# Patient Record
Sex: Female | Born: 1960 | Race: Black or African American | Hispanic: No | Smoking: Never smoker
Health system: Southern US, Community
[De-identification: ages and names within clinical notes are randomized; demographics above are authoritative.]

## PROBLEM LIST (undated history)

## (undated) ENCOUNTER — Emergency Department: Discharge status: Absent without leave | Payer: Self-pay

## (undated) DIAGNOSIS — IMO0001 Reserved for inherently not codable concepts without codable children: Secondary | ICD-10-CM

## (undated) DIAGNOSIS — J45909 Unspecified asthma, uncomplicated: Secondary | ICD-10-CM

## (undated) DIAGNOSIS — R030 Elevated blood-pressure reading, without diagnosis of hypertension: Secondary | ICD-10-CM

## (undated) DIAGNOSIS — M1990 Unspecified osteoarthritis, unspecified site: Secondary | ICD-10-CM

## (undated) HISTORY — DX: Unspecified osteoarthritis, unspecified site: M19.90

## (undated) HISTORY — DX: Elevated blood-pressure reading, without diagnosis of hypertension: R03.0

## (undated) HISTORY — DX: Reserved for inherently not codable concepts without codable children: IMO0001

## (undated) HISTORY — DX: Unspecified asthma, uncomplicated: J45.909

---

## 2006-12-11 ENCOUNTER — Emergency Department: Payer: Self-pay | Admitting: Internal Medicine

## 2006-12-24 ENCOUNTER — Encounter: Payer: Self-pay | Admitting: Obstetrics & Gynecology

## 2006-12-24 ENCOUNTER — Ambulatory Visit: Payer: Self-pay | Admitting: Obstetrics & Gynecology

## 2007-09-28 ENCOUNTER — Emergency Department: Payer: Self-pay | Admitting: Emergency Medicine

## 2007-10-19 ENCOUNTER — Emergency Department: Payer: Self-pay | Admitting: Emergency Medicine

## 2008-08-23 ENCOUNTER — Encounter: Payer: Self-pay | Admitting: Obstetrics & Gynecology

## 2008-08-23 ENCOUNTER — Ambulatory Visit: Payer: Self-pay | Admitting: Obstetrics & Gynecology

## 2008-09-16 ENCOUNTER — Ambulatory Visit: Payer: Self-pay | Admitting: Obstetrics & Gynecology

## 2008-09-24 ENCOUNTER — Emergency Department: Payer: Self-pay | Admitting: Emergency Medicine

## 2008-09-29 ENCOUNTER — Ambulatory Visit: Payer: Self-pay | Admitting: Obstetrics & Gynecology

## 2009-05-26 ENCOUNTER — Ambulatory Visit: Payer: Self-pay | Admitting: Family Medicine

## 2010-08-28 NOTE — Assessment & Plan Note (Signed)
NAME:  Gloria Fuller, Gloria Fuller NO.:  [id number]   MEDICAL RECORD NO.:  [id number]          PATIENT TYPE:  POB   LOCATION:  [facility] at [facility]         FACILITY:  [facility]   PHYSICIAN:  Johnella Moloney, MD        DATE OF BIRTH:  April 22, 1960   DATE OF SERVICE:  08/23/2008                                  CLINIC NOTE   The patient is a 50 year old gravida 6, para 4-0-2-4 African American  female who is here today for her annual gynecologic examination.  The  patient reports that her menstrual periods have been irregular over the  last 4 months and upon on explaining what she meant by regularity, said  that her 2 cycles came about 3 weeks apart and then the last 2 cycles  came about 6-7 weeks apart, but on average she did have 1 period per  month for the last 4 months.  She does not report any increased  bleeding, lightheadedness, dizziness or any other symptoms and she says  that every time the bleeding comes it is just a normal kind of period  bleeding that last for 5 days and does not report passing any clots or  any other problems.  She also reports having increased night sweats, but  denies hot flashes or mood swings.  The patient denies any other  gynecologic problems.  She is sexually active with her husband, reports  no issues, denies any intermenstrual bleeding, abnormal vaginal  discharge or any other symptoms.   PAST OBSTETRICS AND GYNECOLOGIC HISTORY:  G6, P 4-0-2-4.  She has had 4  vaginal deliveries, 2 miscarriages.  The patient has had normal Pap  smears.  Her last one was in December 24, 2006.  Her last mammogram was  also in September 2008 and was normal.  She denies any history of  sexually transmitted infections.   PAST MEDICAL HISTORY:  Obesity.   PAST SURGICAL HISTORY:  Dilation and curettage.   MEDICATIONS:  None.   ALLERGIES:  No known drug allergies.   SOCIAL HISTORY:  The patient works as an International aid/development worker at [employer].  She denies any smoking,  alcohol, or illicit drug use.  She also  denies any physical or sexual abuse.   FAMILY HISTORY:  Remarkable for diabetes and hypertension.  She denies  any breast, gynecological colon cancer.   REVIEW OF SYSTEMS:  Entirely negative unless mentioned in the history of  present illness.  Of note, a 14-point comprehensive review of systems  was reviewed with the patient.   PHYSICAL EXAMINATION:  VITAL SIGNS:  Blood pressure 144/90, pulse 87,  weight 202 pounds, and height 5 feet 7-1/2 inches.  GENERAL:  No apparent distress.  HEENT:  Normocephalic, atraumatic.  NECK:  Supple.  Normal thyroid.  LUNGS:  Clear to auscultation bilaterally.  HEART:  Regular rate and rhythm.  BREASTS:  Symmetric in size, nontender, no abnormal masses, nipple  drainage, skin changes or lymphadenopathy.  ABDOMEN:  Soft, nontender, nondistended.  EXTREMITIES:  There is no clubbing, cyanosis or edema.  PELVIC:  Normal external female genitalia.  Pink, well-rugated vagina  and normal discharge.  Multiparous  cervical os noted.  Pap smear  obtained on bimanual exam.  The patient does have an enlarged about 15-  to 16-week size globular uterus, nontender on palpation.  Adnexa were  normal in size and nontender.   ASSESSMENT AND PLAN:  The patient is a 50 year old gravida 6, para 4-0-2-  4 here for annual gynecologic exam.  Pap smear was done today.  We will  follow up her results.  The patient will also be booked for a mammogram  at the end of this appointment.  As for preventative health maintenance,  the patient is interested and get a fasting lipid profile.  This will be  checked at another date and the patient will return when she is fasting  to have this.  At the same time, she will be checked for a TSH and a  FSH, also given her bleeding pattern and the patient is also interested  in an STI screening, so a gonorrhea and chlamydia will be reflexively  checked from her Pap smear sample and she will get a serum  tested for  HIV, hepatitis B, C, and RPR.  As for the patient's bleeding pattern,  she is likely perimenopausal, checking an FSH will may give an idea if  she is perimenopausal or not and a TSH would also rule out any thyroid  abnormalities which could be causing this pattern.  The patient was told  that if her bleeding pattern continues to be a problematic, she might  need a pelvic ultrasound and also an endometrial biopsy.  However, it  does sound like a perimenopausal bleeding pattern at this point.  We  will follow up on results of her lab tests and the patient was told to  come back for any further gynecologic problems or for her annual  examination in 1 year.           ______________________________  Johnella Moloney, MD     UD/MEDQ  D:  08/23/2008  T:  08/24/2008  Job:  [id number]

## 2011-02-08 ENCOUNTER — Ambulatory Visit: Payer: Self-pay | Admitting: Family Medicine

## 2011-06-06 ENCOUNTER — Emergency Department: Payer: Self-pay | Admitting: Emergency Medicine

## 2011-07-15 ENCOUNTER — Emergency Department: Payer: Self-pay | Admitting: Emergency Medicine

## 2011-07-15 LAB — COMPREHENSIVE METABOLIC PANEL
Alkaline Phosphatase: 68 U/L (ref 50–136)
Bilirubin,Total: 0.3 mg/dL (ref 0.2–1.0)
Calcium, Total: 9.4 mg/dL (ref 8.5–10.1)
Co2: 28 mmol/L (ref 21–32)
Glucose: 75 mg/dL (ref 65–99)
SGOT(AST): 23 U/L (ref 15–37)
SGPT (ALT): 21 U/L
Total Protein: 7.9 g/dL (ref 6.4–8.2)

## 2011-07-15 LAB — URINALYSIS, COMPLETE
Bilirubin,UR: NEGATIVE
Ketone: NEGATIVE
Nitrite: POSITIVE
Ph: 7 (ref 4.5–8.0)
Protein: NEGATIVE

## 2011-07-15 LAB — LIPASE, BLOOD: Lipase: 118 U/L (ref 73–393)

## 2011-07-15 LAB — CBC: RBC: 4.01 10*6/uL (ref 3.80–5.20)

## 2011-12-26 ENCOUNTER — Ambulatory Visit: Payer: Self-pay | Admitting: Family Medicine

## 2014-06-02 ENCOUNTER — Emergency Department: Payer: Self-pay | Admitting: Internal Medicine

## 2015-03-30 ENCOUNTER — Ambulatory Visit (INDEPENDENT_AMBULATORY_CARE_PROVIDER_SITE_OTHER): Payer: 59 | Admitting: Family Medicine

## 2015-03-30 ENCOUNTER — Encounter: Payer: Self-pay | Admitting: Family Medicine

## 2015-03-30 VITALS — BP 148/88 | HR 87 | Temp 98.7°F | Resp 20 | Wt 187.1 lb

## 2015-03-30 DIAGNOSIS — E669 Obesity, unspecified: Secondary | ICD-10-CM | POA: Diagnosis not present

## 2015-03-30 DIAGNOSIS — IMO0001 Reserved for inherently not codable concepts without codable children: Secondary | ICD-10-CM

## 2015-03-30 DIAGNOSIS — Z0000 Encounter for general adult medical examination without abnormal findings: Secondary | ICD-10-CM

## 2015-03-30 DIAGNOSIS — R030 Elevated blood-pressure reading, without diagnosis of hypertension: Secondary | ICD-10-CM | POA: Diagnosis not present

## 2015-03-30 NOTE — Patient Instructions (Addendum)
DASH Eating Plan DASH stands for "Dietary Approaches to Stop Hypertension." The DASH eating plan is a healthy eating plan that has been shown to reduce high blood pressure (hypertension). Additional health benefits may include reducing the risk of type 2 diabetes mellitus, heart disease, and stroke. The DASH eating plan may also help with weight loss. WHAT DO I NEED TO KNOW ABOUT THE DASH EATING PLAN? For the DASH eating plan, you will follow these general guidelines:  Choose foods with a percent daily value for sodium of less than 5% (as listed on the food label).  Use salt-free seasonings or herbs instead of table salt or sea salt.  Check with your health care provider or pharmacist before using salt substitutes.  Eat lower-sodium products, often labeled as "lower sodium" or "no salt added."  Eat fresh foods.  Eat more vegetables, fruits, and low-fat dairy products.  Choose whole grains. Look for the word "whole" as the first word in the ingredient list.  Choose fish and skinless chicken or turkey more often than red meat. Limit fish, poultry, and meat to 6 oz (170 g) each day.  Limit sweets, desserts, sugars, and sugary drinks.  Choose heart-healthy fats.  Limit cheese to 1 oz (28 g) per day.  Eat more home-cooked food and less restaurant, buffet, and fast food.  Limit fried foods.  Cook foods using methods other than frying.  Limit canned vegetables. If you do use them, rinse them well to decrease the sodium.  When eating at a restaurant, ask that your food be prepared with less salt, or no salt if possible. WHAT FOODS CAN I EAT? Seek help from a dietitian for individual calorie needs. Grains Whole grain or whole wheat bread. Eklund rice. Whole grain or whole wheat pasta. Quinoa, bulgur, and whole grain cereals. Low-sodium cereals. Corn or whole wheat flour tortillas. Whole grain cornbread. Whole grain crackers. Low-sodium crackers. Vegetables Fresh or frozen vegetables  (raw, steamed, roasted, or grilled). Low-sodium or reduced-sodium tomato and vegetable juices. Low-sodium or reduced-sodium tomato sauce and paste. Low-sodium or reduced-sodium canned vegetables.  Fruits All fresh, canned (in natural juice), or frozen fruits. Meat and Other Protein Products Ground beef (85% or leaner), grass-fed beef, or beef trimmed of fat. Skinless chicken or turkey. Ground chicken or turkey. Pork trimmed of fat. All fish and seafood. Eggs. Dried beans, peas, or lentils. Unsalted nuts and seeds. Unsalted canned beans. Dairy Low-fat dairy products, such as skim or 1% milk, 2% or reduced-fat cheeses, low-fat ricotta or cottage cheese, or plain low-fat yogurt. Low-sodium or reduced-sodium cheeses. Fats and Oils Tub margarines without trans fats. Light or reduced-fat mayonnaise and salad dressings (reduced sodium). Avocado. Safflower, olive, or canola oils. Natural peanut or almond butter. Other Unsalted popcorn and pretzels. The items listed above may not be a complete list of recommended foods or beverages. Contact your dietitian for more options. WHAT FOODS ARE NOT RECOMMENDED? Grains White bread. White pasta. White rice. Refined cornbread. Bagels and croissants. Crackers that contain trans fat. Vegetables Creamed or fried vegetables. Vegetables in a cheese sauce. Regular canned vegetables. Regular canned tomato sauce and paste. Regular tomato and vegetable juices. Fruits Dried fruits. Canned fruit in light or heavy syrup. Fruit juice. Meat and Other Protein Products Fatty cuts of meat. Ribs, chicken wings, bacon, sausage, bologna, salami, chitterlings, fatback, hot dogs, bratwurst, and packaged luncheon meats. Salted nuts and seeds. Canned beans with salt. Dairy Whole or 2% milk, cream, half-and-half, and cream cheese. Whole-fat or sweetened yogurt. Full-fat   cheeses or blue cheese. Nondairy creamers and whipped toppings. Processed cheese, cheese spreads, or cheese  curds. Condiments Onion and garlic salt, seasoned salt, table salt, and sea salt. Canned and packaged gravies. Worcestershire sauce. Tartar sauce. Barbecue sauce. Teriyaki sauce. Soy sauce, including reduced sodium. Steak sauce. Fish sauce. Oyster sauce. Cocktail sauce. Horseradish. Ketchup and mustard. Meat flavorings and tenderizers. Bouillon cubes. Hot sauce. Tabasco sauce. Marinades. Taco seasonings. Relishes. Fats and Oils Butter, stick margarine, lard, shortening, ghee, and bacon fat. Coconut, palm kernel, or palm oils. Regular salad dressings. Other Pickles and olives. Salted popcorn and pretzels. The items listed above may not be a complete list of foods and beverages to avoid. Contact your dietitian for more information. WHERE CAN I FIND MORE INFORMATION? National Heart, Lung, and Blood Institute: [employer]   This information is not intended to replace advice given to you by your health care provider. Make sure you discuss any questions you have with your health care provider.   Document Released: 03/21/2011 Document Revised: 04/22/2014 Document Reviewed: 02/03/2013 Elsevier Interactive Patient Education 2016 [employer]. Obesity Obesity is defined as having too much total body fat and a body mass index (BMI) of 30 or more. BMI is an estimate of body fat and is calculated from your height and weight. BMI is typically calculated by your health care provider during regular wellness visits. Obesity happens when you consume more calories than you can burn by exercising or performing daily physical tasks. Prolonged obesity can cause major illnesses or emergencies, such as:  Stroke.  Heart disease.  Diabetes.  Cancer.  Arthritis.  High blood pressure (hypertension).  High cholesterol.  Sleep apnea.  Erectile dysfunction.  Infertility problems. CAUSES   Regularly eating unhealthy foods.  Physical inactivity.  Certain disorders,  such as an underactive thyroid (hypothyroidism), Cushing's syndrome, and polycystic ovarian syndrome.  Certain medicines, such as steroids, some depression medicines, and antipsychotics.  Genetics.  Lack of sleep. DIAGNOSIS A health care provider can diagnose obesity after calculating your BMI. Obesity will be diagnosed if your BMI is 30 or higher. There are other methods of measuring obesity levels. Some other methods include measuring your skinfold thickness, your waist circumference, and comparing your hip circumference to your waist circumference. TREATMENT  A healthy treatment program includes some or all of the following:  Long-term dietary changes.  Exercise and physical activity.  Behavioral and lifestyle changes.  Medicine only under the supervision of your health care provider. Medicines may help, but only if they are used with diet and exercise programs. If your BMI is 40 or higher, your health care provider may recommend specialized surgery or programs to help with weight loss. An unhealthy treatment program includes:  Fasting.  Fad diets.  Supplements and drugs. These choices do not succeed in long-term weight control. HOME CARE INSTRUCTIONS  Exercise and perform physical activity as directed by your health care provider. To increase physical activity, try the following:  Use stairs instead of elevators.  Park farther away from store entrances.  Garden, bike, or walk instead of watching television or using the computer.  Eat healthy, low-calorie foods and drinks on a regular basis. Eat more fruits and vegetables. Use low-calorie cookbooks or take healthy cooking classes.  Limit fast food, sweets, and processed snack foods.  Eat smaller portions.  Keep a daily journal of everything you eat. There are many free websites to help you with this. It may be helpful to measure your foods so you can determine if you  are eating the correct portion sizes.  Avoid  drinking alcohol. Drink more water and drinks without calories.  Take vitamins and supplements only as recommended by your health care provider.  Weight-loss support groups, Government social research officerregistered dietitians, counselors, and stress reduction education can also be very helpful. SEEK IMMEDIATE MEDICAL CARE IF:  You have chest pain or tightness.  You have trouble breathing or feel short of breath.  You have weakness or leg numbness.  You feel confused or have trouble talking.  You have sudden changes in your vision.   This information is not intended to replace advice given to you by your health care provider. Make sure you discuss any questions you have with your health care provider.   Document Released: 05/09/2004 Document Revised: 04/22/2014 Document Reviewed: 05/08/2011 Elsevier Interactive Patient Education [employer]2016 Elsevier Inc.

## 2015-03-30 NOTE — Progress Notes (Signed)
Name: Gloria Fuller   MRN: [id number]    DOB: 05/03/1960   Date:03/30/2015       Progress Note  Subjective  Chief Complaint  Chief Complaint  Patient presents with  . Annual Exam    HPI  54 year old presenting for annual H&P. No ongoing medical issues  Past Medical History  Diagnosis Date  . Elevated blood pressure   . Asthma     Social History  Substance Use Topics  . Smoking status: Never Smoker   . Smokeless tobacco: Not on file  . Alcohol Use: No    No current outpatient prescriptions on file.  No Known Allergies  Review of Systems  Constitutional: Negative for fever, chills and weight loss.  HENT: Negative for congestion, hearing loss, sore throat and tinnitus.   Eyes: Negative for blurred vision, double vision and redness.  Respiratory: Negative for cough, hemoptysis and shortness of breath.   Cardiovascular: Negative for chest pain, palpitations, orthopnea, claudication and leg swelling.  Gastrointestinal: Negative for heartburn, nausea, vomiting, diarrhea, constipation and blood in stool.  Genitourinary: Negative for dysuria, urgency, frequency and hematuria.  Musculoskeletal: Negative for myalgias, back pain, joint pain, falls and neck pain.  Skin: Negative for itching.  Neurological: Negative for dizziness, tingling, tremors, focal weakness, seizures, loss of consciousness, weakness and headaches.  Endo/Heme/Allergies: Does not bruise/bleed easily.  Psychiatric/Behavioral: Negative for depression and substance abuse. The patient is not nervous/anxious and does not have insomnia.      Objective  Filed Vitals:   03/30/15 1310  BP: 148/88  Pulse: 87  Temp: 98.7 F (37.1 C)  Resp: 20  Weight: 187 lb 1 oz (84.851 kg)  SpO2: 99%     Physical Exam  Constitutional: She is oriented to person, place, and time and well-developed, well-nourished, and in no distress.  HENT:  Head: Normocephalic.  Eyes: EOM are normal. Pupils are equal, round, and  reactive to light.  Neck: Normal range of motion. No thyromegaly present.  Cardiovascular: Normal rate, regular rhythm and normal heart sounds.   No murmur heard. Pulmonary/Chest: Effort normal and breath sounds normal.  Breasts are without asymmetry dimpling or discharge or tenderness or masses  Abdominal: Soft. Bowel sounds are normal.  Genitourinary:  Per gynecologist  Musculoskeletal: Normal range of motion. She exhibits no edema.  Neurological: She is alert and oriented to person, place, and time. No cranial nerve deficit. Gait normal.  Skin: Skin is warm and dry. No rash noted.  Psychiatric: Memory and affect normal.      Assessment & Plan  1. Annual physical exam  - Ambulatory referral to Gastroenterology for colonoscopy  2. Elevated BP DASH diet  3. Obesity Obesity handout and encouraged exercise reassess in 2-3 months

## 2015-03-31 DIAGNOSIS — E669 Obesity, unspecified: Secondary | ICD-10-CM | POA: Insufficient documentation

## 2015-04-24 NOTE — Addendum Note (Signed)
Addended by: Dennison MascotMORRISEY, Commodore Bellew on: 04/24/2015 04:13 PM   Modules accepted: Kipp BroodSmartSet

## 2015-06-01 ENCOUNTER — Ambulatory Visit: Payer: 59 | Admitting: Family Medicine

## 2015-07-17 ENCOUNTER — Ambulatory Visit: Payer: 59 | Admitting: Family Medicine

## 2015-10-23 DIAGNOSIS — H524 Presbyopia: Secondary | ICD-10-CM | POA: Diagnosis not present

## 2015-12-04 ENCOUNTER — Encounter: Payer: Self-pay | Admitting: Family Medicine

## 2015-12-04 ENCOUNTER — Ambulatory Visit
Admission: RE | Admit: 2015-12-04 | Discharge: 2015-12-04 | Disposition: A | Discharge status: Home / Self Care | Payer: 59 | Source: Ambulatory Visit | Attending: Family Medicine | Admitting: Family Medicine

## 2015-12-04 ENCOUNTER — Ambulatory Visit (INDEPENDENT_AMBULATORY_CARE_PROVIDER_SITE_OTHER): Payer: 59 | Admitting: Family Medicine

## 2015-12-04 DIAGNOSIS — R05 Cough: Secondary | ICD-10-CM | POA: Diagnosis not present

## 2015-12-04 DIAGNOSIS — J069 Acute upper respiratory infection, unspecified: Secondary | ICD-10-CM | POA: Diagnosis not present

## 2015-12-04 DIAGNOSIS — R062 Wheezing: Secondary | ICD-10-CM | POA: Diagnosis not present

## 2015-12-04 DIAGNOSIS — J90 Pleural effusion, not elsewhere classified: Secondary | ICD-10-CM | POA: Insufficient documentation

## 2015-12-04 DIAGNOSIS — R030 Elevated blood-pressure reading, without diagnosis of hypertension: Secondary | ICD-10-CM | POA: Insufficient documentation

## 2015-12-04 HISTORY — DX: Elevated blood-pressure reading, without diagnosis of hypertension: R03.0

## 2015-12-04 HISTORY — DX: Wheezing: R06.2

## 2015-12-04 HISTORY — DX: Acute upper respiratory infection, unspecified: J06.9

## 2015-12-04 NOTE — Progress Notes (Signed)
Name: Gloria DeitersCire R McIntyre   MRN: [id number]    DOB: 09-19-1960   Date:12/04/2015       Progress Note  Subjective  Chief Complaint  Chief Complaint  Patient presents with  . Hypertension    follow up blood pressure  . URI    cough, congestion for 1 week (taking otc) medication   This patient is usually followed by Dr. Thana AtesMorrisey, new to me.  URI   This is a new problem. Associated symptoms include congestion, coughing and rhinorrhea. Pertinent negatives include no chest pain, dysuria, ear pain, rash, sinus pain or wheezing. She has tried decongestant for the symptoms.   Elevated Blood Pressure: Pt.'s Blood Pressure is elevated today at 150/90, she has never been diagnosed with hypertension. Her Blood Pressure has been normal in the past. She just returned from [place] to [place] and [place], and has been battling acute upper respiratory symptoms taking OTC Loratadine. She has also been fatigued and stressed out recently because of husband's diagnosis of prostate cancer.    History reviewed. No pertinent past medical history.  History reviewed. No pertinent surgical history.  History reviewed. No pertinent family history.  Social History   Social History  . Marital status: Married    Spouse name: N/A  . Number of children: N/A  . Years of education: N/A   Occupational History  . Not on file.   Social History Main Topics  . Smoking status: Never Smoker  . Smokeless tobacco: Never Used  . Alcohol use No  . Drug use: No  . Sexual activity: Yes    Partners: Male   Other Topics Concern  . Not on file   Social History Narrative  . No narrative on file    No current outpatient prescriptions on file.  No Known Allergies   Review of Systems  Constitutional: Negative for chills and fever.  HENT: Positive for congestion and rhinorrhea. Negative for ear pain.   Respiratory: Positive for cough and shortness of breath. Negative for wheezing.   Cardiovascular:  Negative for chest pain and leg swelling.  Genitourinary: Negative for dysuria.  Skin: Negative for rash.    Objective  Vitals:   12/04/15 1535  BP: (!) 150/92  Pulse: 78  Resp: 16  Temp: 98 F (36.7 C)  TempSrc: Oral  SpO2: 93%  Weight: 190 lb 6.4 oz (86.4 kg)  Height: 5\' 7"  (1.702 m)    Physical Exam  Constitutional: She is oriented to person, place, and time and well-developed, well-nourished, and in no distress.  HENT:  Head: Normocephalic and atraumatic.  Right Ear: Tympanic membrane and ear canal normal. No drainage.  Left Ear: Tympanic membrane and ear canal normal. No drainage.  Mouth/Throat: No posterior oropharyngeal erythema.  Nasal mucosal inflammation, turbinates enlarged.  Cardiovascular: S1 normal, S2 normal and normal heart sounds.  Exam reveals no gallop.   No murmur heard. Pulmonary/Chest: Effort normal. No respiratory distress. She has no decreased breath sounds. She has wheezes in the right upper field.  Neurological: She is alert and oriented to person, place, and time.  Psychiatric: Mood, memory, affect and judgment normal.  Nursing note and vitals reviewed.    Assessment & Plan  1. Wheezing on auscultation Along with cough, obtain chest x-ray. - DG Chest 2 View; Future  2. Elevated blood pressure reading without diagnosis of hypertension BP worse on manual repeat, stressed with patient that her fatigue and stress could certainly play a role but also the fact  that she has an acute upper respiratory infection can raise her blood pressure. Advised to check her blood pressure at home and return in 10 days for repeat evaluation and consideration of pharmacotherapy.  3. Upper respiratory infection Encouraged symptomatic and conservative treatment for now. No indication for antibiotics.   Savon Bordonaro Asad A. Faylene KurtzShah [facility] 12/04/2015 3:42 PM

## 2015-12-07 ENCOUNTER — Telehealth: Payer: Self-pay

## 2015-12-07 ENCOUNTER — Telehealth: Payer: Self-pay | Admitting: Family Medicine

## 2015-12-07 DIAGNOSIS — J90 Pleural effusion, not elsewhere classified: Secondary | ICD-10-CM

## 2015-12-07 HISTORY — DX: Pleural effusion, not elsewhere classified: J90

## 2015-12-07 NOTE — Telephone Encounter (Signed)
Patient has been notified of lab results and confirmed she has confirmed referral for Pulmonology

## 2015-12-07 NOTE — Telephone Encounter (Signed)
Referral to pulmonology is entered

## 2015-12-07 NOTE — Telephone Encounter (Signed)
Patient has been notified of Chest x-ray results

## 2015-12-11 ENCOUNTER — Ambulatory Visit (INDEPENDENT_AMBULATORY_CARE_PROVIDER_SITE_OTHER): Payer: 59 | Admitting: Internal Medicine

## 2015-12-11 ENCOUNTER — Encounter: Payer: Self-pay | Admitting: Internal Medicine

## 2015-12-11 VITALS — BP 150/88 | HR 90 | Ht 66.5 in | Wt 187.0 lb

## 2015-12-11 DIAGNOSIS — R059 Cough, unspecified: Secondary | ICD-10-CM

## 2015-12-11 DIAGNOSIS — R05 Cough: Secondary | ICD-10-CM

## 2015-12-11 MED ORDER — ALBUTEROL SULFATE HFA 108 (90 BASE) MCG/ACT IN AERS: 2.0000 | INHALATION_SPRAY | Freq: Four times a day (QID) | RESPIRATORY_TRACT | 2 refills | Status: DC | PRN

## 2015-12-11 NOTE — Patient Instructions (Signed)
ALbuterol as needed   Bronchospasm, Adult A bronchospasm is a spasm or tightening of the airways going into the lungs. During a bronchospasm breathing becomes more difficult because the airways get smaller. When this happens there can be coughing, a whistling sound when breathing (wheezing), and difficulty breathing. Bronchospasm is often associated with asthma, but not all patients who experience a bronchospasm have asthma. CAUSES  A bronchospasm is caused by inflammation or irritation of the airways. The inflammation or irritation may be triggered by:   Allergies (such as to animals, pollen, food, or mold). Allergens that cause bronchospasm may cause wheezing immediately after exposure or many hours later.   Infection. Viral infections are believed to be the most common cause of bronchospasm.   Exercise.   Irritants (such as pollution, cigarette smoke, strong odors, aerosol sprays, and paint fumes).   Weather changes. Winds increase molds and pollens in the air. Rain refreshes the air by washing irritants out. Cold air may cause inflammation.   Stress and emotional upset.  SIGNS AND SYMPTOMS   Wheezing.   Excessive nighttime coughing.   Frequent or severe coughing with a simple cold.   Chest tightness.   Shortness of breath.  DIAGNOSIS  Bronchospasm is usually diagnosed through a history and physical exam. Tests, such as chest X-rays, are sometimes done to look for other conditions. TREATMENT   Inhaled medicines can be given to open up your airways and help you breathe. The medicines can be given using either an inhaler or a nebulizer machine.  Corticosteroid medicines may be given for severe bronchospasm, usually when it is associated with asthma. HOME CARE INSTRUCTIONS   Always have a plan prepared for seeking medical care. Know when to call your health care provider and local emergency services (911 in the [place] Know where you can access local emergency  care.  Only take medicines as directed by your health care provider.  If you were prescribed an inhaler or nebulizer machine, ask your health care provider to explain how to use it correctly. Always use a spacer with your inhaler if you were given one.  It is necessary to remain calm during an attack. Try to relax and breathe more slowly.  Control your home environment in the following ways:   Change your heating and air conditioning filter at least once a month.   Limit your use of fireplaces and wood stoves.  Do not smoke and do not allow smoking in your home.   Avoid exposure to perfumes and fragrances.   Get rid of pests (such as roaches and mice) and their droppings.   Throw away plants if you see mold on them.   Keep your house clean and dust free.   Replace carpet with wood, tile, or vinyl flooring. Carpet can trap dander and dust.   Use allergy-proof pillows, mattress covers, and box spring covers.   Wash bed sheets and blankets every week in hot water and dry them in a dryer.   Use blankets that are made of polyester or cotton.   Wash hands frequently. SEEK MEDICAL CARE IF:   You have muscle aches.   You have chest pain.   The sputum changes from clear or white to yellow, green, gray, or bloody.   The sputum you cough up gets thicker.   There are problems that may be related to the medicine you are given, such as a rash, itching, swelling, or trouble breathing.  SEEK IMMEDIATE MEDICAL CARE IF:   You  have worsening wheezing and coughing even after taking your prescribed medicines.   You have increased difficulty breathing.   You develop severe chest pain. MAKE SURE YOU:   Understand these instructions.  Will watch your condition.  Will get help right away if you are not doing well or get worse.   This information is not intended to replace advice given to you by your health care provider. Make sure you discuss any questions you  have with your health care provider.   Document Released: 04/04/2003 Document Revised: 04/22/2014 Document Reviewed: 09/21/2012 Elsevier Interactive Patient Education [employer]

## 2015-12-11 NOTE — Progress Notes (Signed)
[facility] Consultation      Date: 12/11/2015,   MRN# [id number] Gloria Fuller [birth year] Code Status:  Code Status History    This patient does not have a recorded code status. Please follow your organizational policy for patients in this situation.     Hosp day:@LENGTHOFSTAYDAYS @ Referring MD: @ATDPROV @     PCP:      AdmissionWeight: 187 lb (84.8 kg)                 CurrentWeight: 187 lb (84.8 kg) Gloria Fuller is a 55 y.o. old female seen in consultation for cough the request of Dr. Sherryll Burger     CHIEF COMPLAINT:   cough   HISTORY OF PRESENT ILLNESS  55 yo pleasant AAF seen today for cough Started 2 weeks ago with associated Upper resp tract infection  She has nonproductive cough without wheezing Her symptoms have improved with time, patient started on loratidine which has helped She has NO SOB/DOE Has mild residual cough  No signs of infection at this time  CXR report states small left pleural effusion, I DO NOT SEE any evidence of significant effusion at this time I explained to patient the findings  Patient is nonsmoker Has second hand smoking exposure all her life and currently her husband smokes    PAST MEDICAL HISTORY   History reviewed. No pertinent past medical history.  HTN   SURGICAL HISTORY   History reviewed. No pertinent surgical history. NO surgries  FAMILY HISTORY   Family History  Problem Relation Age of Onset  . Scleroderma Mother   . Aneurysm Father   . Hypertension Maternal Grandmother      SOCIAL HISTORY   Social History  Substance Use Topics  . Smoking status: Passive Smoke Exposure - Never Smoker  . Smokeless tobacco: Never Used  . Alcohol use No     MEDICATIONS    Home Medication:    Current Medication: No current outpatient prescriptions on file.    ALLERGIES   Review of patient's allergies indicates no known allergies.     REVIEW OF SYSTEMS   Review of Systems    Constitutional: Negative for chills, diaphoresis, fever, malaise/fatigue and weight loss.  HENT: Negative for congestion and hearing loss.   Eyes: Negative for blurred vision and double vision.  Respiratory: Positive for cough. Negative for hemoptysis, sputum production, shortness of breath and wheezing.   Cardiovascular: Negative for chest pain, palpitations, orthopnea and leg swelling.  Gastrointestinal: Negative for abdominal pain, heartburn, nausea and vomiting.  Genitourinary: Negative for dysuria and urgency.  Musculoskeletal: Negative for back pain, myalgias and neck pain.  Skin: Negative for rash.  Neurological: Negative for dizziness, tingling, tremors, weakness and headaches.  Endo/Heme/Allergies: Does not bruise/bleed easily.  Psychiatric/Behavioral: Negative for depression, substance abuse and suicidal ideas.  All other systems reviewed and are negative.    VS: BP (!) 150/88 (BP Location: Left Arm, Cuff Size: Normal)   Pulse 90   Ht 5' 6.5" (1.689 m)   Wt 187 lb (84.8 kg)   LMP 09/03/2015   SpO2 99%   BMI 29.73 kg/m      PHYSICAL EXAM  Physical Exam  Constitutional: She is oriented to person, place, and time. She appears well-developed and well-nourished. No distress.  HENT:  Head: Normocephalic and atraumatic.  Mouth/Throat: No oropharyngeal exudate.  Eyes: EOM are normal. Pupils are equal, round, and reactive to light. No scleral icterus.  Neck: Normal range of motion. Neck supple.  Cardiovascular: Normal rate, regular rhythm and normal heart sounds.   No murmur heard. Pulmonary/Chest: No stridor. No respiratory distress. She has no wheezes.  Abdominal: Soft. Bowel sounds are normal.  Musculoskeletal: Normal range of motion. She exhibits no edema.  Neurological: She is alert and oriented to person, place, and time. No cranial nerve deficit.  Skin: Skin is warm. She is not diaphoretic.  Psychiatric: She has a normal mood and affect.          IMAGING     Dg Chest 2 View  Result Date: 12/05/2015 CLINICAL DATA:  Cough and congestion. EXAM: CHEST  2 VIEW COMPARISON:  No recent prior . FINDINGS: Mediastinum and hilar structures normal. Lungs are clear. Heart size normal. Small left pleural effusion. No pneumothorax. IMPRESSION: Small left pleural effusion, otherwise negative chest . Electronically Signed   By: Maisie Fushomas  Register   On: 12/05/2015 07:49    CXR images Reveiwed 12/11/2015 I do NOT see any evidence of effusions, the costaphrenic angles are clear, no opacities seen    ASSESSMENT/PLAN   55 yo pleasant AAF with acute cough from URI with mild intermittent reactive airways disease with improving symptoms in setting of allergic rhinitis with NL looking CXR  1.avoid second hand smoke exposure 2.albuterol as needed 3.no need for abx or steroids at this time 4.follow up as needed   I have personally obtained a history, examined the patient, evaluated laboratory and independently reviewed imaging results, formulated the assessment and plan and placed orders.  The Patient requires high complexity decision making for assessment and support, frequent evaluation and titration of therapies, application of advanced monitoring technologies and extensive interpretation of multiple databases.  Patient satisfied with Plan of action and management. All questions answered  Lucie LeatherKurian David Sinaya Minogue, M.D.  [facility]  Medical Director [facility] Medical Director [facility]

## 2015-12-11 NOTE — Progress Notes (Signed)
Patient ID: Gloria Fuller, female   DOB: 02-05-1961, 55 y.o.   MRN: [id number] Patient seen in the office today and instructed on use of HFA.  Patient expressed understanding and demonstrated technique.

## 2015-12-14 ENCOUNTER — Ambulatory Visit: Payer: 59 | Admitting: Family Medicine

## 2015-12-19 ENCOUNTER — Ambulatory Visit (INDEPENDENT_AMBULATORY_CARE_PROVIDER_SITE_OTHER): Payer: 59 | Admitting: Family Medicine

## 2015-12-19 ENCOUNTER — Encounter: Payer: Self-pay | Admitting: Family Medicine

## 2015-12-19 VITALS — BP 128/88 | HR 76 | Temp 98.1°F | Resp 18 | Ht 67.0 in | Wt 191.4 lb

## 2015-12-19 DIAGNOSIS — R030 Elevated blood-pressure reading, without diagnosis of hypertension: Secondary | ICD-10-CM | POA: Diagnosis not present

## 2015-12-19 NOTE — Progress Notes (Signed)
Name: Gloria Fuller   MRN: [id number]    DOB: 1960-10-20   Date:12/19/2015       Progress Note  Subjective  Chief Complaint  Chief Complaint  Patient presents with  . Hypertension    HPI  Pt. Presents for elevated Blood Pressure follow up, her blood pressure was elevated at 150/92 at her last visit 2 weeks ago, today its 128/3288mmHg. SHe feels well, has been exercising and eating better and hopes to lose weight, which should improve her blood Pressure even further.   No past medical history on file.  No past surgical history on file.  Family History  Problem Relation Age of Onset  . Scleroderma Mother   . Aneurysm Father   . Hypertension Maternal Grandmother     Social History   Social History  . Marital status: Married    Spouse name: N/A  . Number of children: N/A  . Years of education: N/A   Occupational History  . Not on file.   Social History Main Topics  . Smoking status: Passive Smoke Exposure - Never Smoker  . Smokeless tobacco: Never Used  . Alcohol use No  . Drug use: No  . Sexual activity: Yes    Partners: Male   Other Topics Concern  . Not on file   Social History Narrative  . No narrative on file     Current Outpatient Prescriptions:  .  albuterol (PROVENTIL HFA;VENTOLIN HFA) 108 (90 Base) MCG/ACT inhaler, Inhale 2 puffs into the lungs every 6 (six) hours as needed for wheezing or shortness of breath. (Patient not taking: Reported on 12/19/2015), Disp: 1 Inhaler, Rfl: 2  No Known Allergies   Review of Systems  Constitutional: Negative for chills and fever.  Eyes: Negative for blurred vision.  Cardiovascular: Negative for chest pain, palpitations and leg swelling.  Neurological: Negative for headaches.    Objective  Vitals:   12/19/15 1559  BP: 128/88  Pulse: 76  Resp: 18  Temp: 98.1 F (36.7 C)  SpO2: 94%  Weight: 191 lb 7 oz (86.8 kg)  Height: 5\' 7"  (1.702 m)    Physical Exam  Constitutional: She is well-developed,  well-nourished, and in no distress.  Eyes: Pupils are equal, round, and reactive to light.  Cardiovascular: Normal rate, regular rhythm, S1 normal, S2 normal and normal heart sounds.   Pulmonary/Chest: Effort normal and breath sounds normal. She has no wheezes.  Abdominal: Soft. Bowel sounds are normal. There is no tenderness.  Musculoskeletal:       Right ankle: She exhibits no swelling.       Left ankle: She exhibits no swelling.  Nursing note and vitals reviewed.    Assessment & Plan  1. Elevated blood pressure reading without diagnosis of hypertension BP much improved, continue with dietary and lifestyle changes, no indication for pharmacotherapy.   Navpreet Szczygiel Asad A. Faylene KurtzShah [facility] 12/19/2015 4:24 PM

## 2016-01-18 ENCOUNTER — Encounter: Payer: 59 | Admitting: Family Medicine

## 2016-02-22 ENCOUNTER — Encounter: Payer: Self-pay | Admitting: Family Medicine

## 2016-03-01 ENCOUNTER — Encounter: Payer: Self-pay | Admitting: Family Medicine

## 2016-03-01 ENCOUNTER — Ambulatory Visit (INDEPENDENT_AMBULATORY_CARE_PROVIDER_SITE_OTHER): Payer: 59 | Admitting: Family Medicine

## 2016-03-01 VITALS — BP 124/70 | HR 88 | Temp 97.2°F | Resp 16 | Ht 67.0 in | Wt 189.4 lb

## 2016-03-01 DIAGNOSIS — L240 Irritant contact dermatitis due to detergents: Secondary | ICD-10-CM | POA: Diagnosis not present

## 2016-03-01 MED ORDER — MOMETASONE FUROATE 0.1 % EX CREA: 1.0000 "application " | TOPICAL_CREAM | Freq: Every day | CUTANEOUS | 0 refills | Status: DC

## 2016-03-01 NOTE — Progress Notes (Signed)
Name: Gloria Fuller   MRN: [id number]    DOB: [birth year]   Date:03/01/2016       Progress Note  Subjective  Chief Complaint  Chief Complaint  Patient presents with  . Rash    rash under left breast for 2 days    Rash  This is a new problem. The current episode started in the past 7 days. The problem has been gradually worsening since onset. Location: left breast and chest wall. The rash is characterized by pain and redness. She was exposed to chemicals (she has washed clothes in a new detergent ). Pertinent negatives include no cough, fatigue, fever, joint pain or shortness of breath. Past treatments include nothing.     Past Medical History:  Diagnosis Date  . Asthma   . Elevated blood pressure     No past surgical history on file.  Family History  Problem Relation Age of Onset  . Scleroderma Mother   . Aneurysm Father   . Hypertension Maternal Grandmother   . Cerebral aneurysm Father     Social History   Social History  . Marital status: Married    Spouse name: N/A  . Number of children: N/A  . Years of education: N/A   Occupational History  . Not on file.   Social History Main Topics  . Smoking status: Passive Smoke Exposure - Never Smoker  . Smokeless tobacco: Never Used  . Alcohol use No  . Drug use: No  . Sexual activity: Yes    Partners: Male   Other Topics Concern  . Not on file   Social History Narrative   ** Merged History Encounter **         Current Outpatient Prescriptions:  Marland Kitchen.  Multiple Vitamin (MULTIVITAMIN) tablet, Take 1 tablet by mouth daily., Disp: , Rfl:   No Known Allergies   Review of Systems  Constitutional: Negative for chills, fatigue, fever and malaise/fatigue.  Respiratory: Negative for cough and shortness of breath.   Musculoskeletal: Negative for joint pain.  Skin: Positive for itching and rash.    Objective  Vitals:   03/01/16 0950  BP: 124/70  Pulse: 88  Resp: 16  Temp: 97.2 F (36.2 C)  TempSrc: Oral    SpO2: 97%  Weight: 189 lb 6.4 oz (85.9 kg)  Height: 5\' 7"  (1.702 m)    Physical Exam  Constitutional: She is well-developed, well-nourished, and in no distress.  Pulmonary/Chest:    Skin: Rash noted. Rash is maculopapular.  maculo-papular erythematous pruritic rash underneath the left breast extending medially over the chest wall.  Nursing note and vitals reviewed.      Assessment & Plan  1. Irritant contact dermatitis due to detergent Advised to avoid wearing clothes washed in the new detergent until they are washed again in her previous detergent. Started on 5-7 days of Elocon cream - mometasone (ELOCON) 0.1 % cream; Apply 1 application topically daily.  Dispense: 45 g; Refill: 0   [place] Monds Asad A. Faylene KurtzShah [facility] 03/01/2016 9:58 AM

## 2016-10-17 ENCOUNTER — Emergency Department
Admission: EM | Admit: 2016-10-17 | Discharge: 2016-10-17 | Disposition: A | Discharge status: Home / Self Care | Payer: 59 | Attending: Emergency Medicine | Admitting: Emergency Medicine

## 2016-10-17 ENCOUNTER — Encounter: Payer: Self-pay | Admitting: Emergency Medicine

## 2016-10-17 ENCOUNTER — Emergency Department: Payer: 59

## 2016-10-17 DIAGNOSIS — Z7722 Contact with and (suspected) exposure to environmental tobacco smoke (acute) (chronic): Secondary | ICD-10-CM | POA: Insufficient documentation

## 2016-10-17 DIAGNOSIS — J45909 Unspecified asthma, uncomplicated: Secondary | ICD-10-CM | POA: Insufficient documentation

## 2016-10-17 DIAGNOSIS — N200 Calculus of kidney: Secondary | ICD-10-CM | POA: Diagnosis not present

## 2016-10-17 DIAGNOSIS — N83202 Unspecified ovarian cyst, left side: Secondary | ICD-10-CM

## 2016-10-17 DIAGNOSIS — N8302 Follicular cyst of left ovary: Secondary | ICD-10-CM | POA: Insufficient documentation

## 2016-10-17 DIAGNOSIS — Z79899 Other long term (current) drug therapy: Secondary | ICD-10-CM | POA: Insufficient documentation

## 2016-10-17 LAB — URINALYSIS, COMPLETE (UACMP) WITH MICROSCOPIC
BACTERIA UA: NONE SEEN
BILIRUBIN URINE: NEGATIVE
Glucose, UA: NEGATIVE mg/dL
Ketones, ur: NEGATIVE mg/dL
LEUKOCYTES UA: NEGATIVE
Nitrite: NEGATIVE
PROTEIN: NEGATIVE mg/dL
Specific Gravity, Urine: 1.015 (ref 1.005–1.030)
pH: 6 (ref 5.0–8.0)

## 2016-10-17 LAB — COMPREHENSIVE METABOLIC PANEL
ALBUMIN: 3.9 g/dL (ref 3.5–5.0)
ALT: 24 U/L (ref 14–54)
AST: 25 U/L (ref 15–41)
Alkaline Phosphatase: 77 U/L (ref 38–126)
Anion gap: 7 (ref 5–15)
BUN: 16 mg/dL (ref 6–20)
CHLORIDE: 103 mmol/L (ref 101–111)
CO2: 28 mmol/L (ref 22–32)
CREATININE: 0.63 mg/dL (ref 0.44–1.00)
Calcium: 9.9 mg/dL (ref 8.9–10.3)
GFR calc Af Amer: >60 mL/min (ref >60)
Glucose, Bld: 130 mg/dL — ABNORMAL HIGH (ref 65–99)
POTASSIUM: 3.4 mmol/L — AB (ref 3.5–5.1)
Sodium: 138 mmol/L (ref 135–145)
Total Bilirubin: 0.4 mg/dL (ref 0.3–1.2)
Total Protein: 8 g/dL (ref 6.5–8.1)

## 2016-10-17 LAB — POCT PREGNANCY, URINE: PREG TEST UR: NEGATIVE

## 2016-10-17 LAB — CBC
HEMATOCRIT: 38.7 % (ref 35.0–47.0)
Hemoglobin: 13 g/dL (ref 12.0–16.0)
MCH: 30 pg (ref 26.0–34.0)
MCHC: 33.6 g/dL (ref 32.0–36.0)
MCV: 89.2 fL (ref 80.0–100.0)
PLATELETS: 263 10*3/uL (ref 150–440)
RBC: 4.34 MIL/uL (ref 3.80–5.20)
RDW: 13.4 % (ref 11.5–14.5)
WBC: 7.4 10*3/uL (ref 3.6–11.0)

## 2016-10-17 LAB — TROPONIN I

## 2016-10-17 LAB — LIPASE, BLOOD: LIPASE: 27 U/L (ref 11–51)

## 2016-10-17 MED ORDER — ONDANSETRON 4 MG PO TBDP: 4.0000 mg | ORAL_TABLET | Freq: Three times a day (TID) | ORAL | 0 refills | Status: DC | PRN

## 2016-10-17 MED ORDER — OXYCODONE-ACETAMINOPHEN 5-325 MG PO TABS: 1.0000 | ORAL_TABLET | ORAL | 0 refills | Status: DC | PRN

## 2016-10-17 MED ORDER — TAMSULOSIN HCL 0.4 MG PO CAPS: 0.4000 mg | ORAL_CAPSULE | Freq: Every day | ORAL | 0 refills | Status: DC

## 2016-10-17 NOTE — ED Notes (Signed)
Pt back from CT

## 2016-10-17 NOTE — Discharge Instructions (Signed)
1. Take pain & nausea medicines as needed (Percocet/Zofran #30). Make sure to take a stool softener while taking narcotic pain medicines. 2. Take Flomax 0.4mg daily x 14 days. 3. Drink plenty of bottled or filtered water daily. 4. Return to the ER for worsening symptoms, persistent vomiting, fever, difficulty breathing or other concerns.  

## 2016-10-17 NOTE — ED Notes (Signed)
Patient transported to CT 

## 2016-10-17 NOTE — ED Triage Notes (Signed)
Pt ambulatory to triage with slow steady gait. Pt c/o upper left sided abdominal pain, denies N/V at this time.

## 2016-10-17 NOTE — ED Provider Notes (Signed)
[facility] Emergency Department Provider Note   ____________________________________________   First MD Initiated Contact with Patient 10/17/16 0354     (approximate)  I have reviewed the triage vital signs and the nursing notes.   HISTORY  Chief Complaint Abdominal Pain    HPI Gloria Fuller is a 56 y.o. female who presents to the ED from home with a chief complaint of left flank and left lower quadrant abdominal pain. Patient states she was at work and experienced onset of pain approximately midnight. Symptoms not associated with fever, chills, nausea, vomiting, hematuria or dysuria.Nothing makes her pain worse. Patient took a Tylenol prior to arrival with complete resolution of her pain at this time. Denies recent travel or trauma.   Past Medical History:  Diagnosis Date  . Asthma   . Elevated blood pressure     Patient Active Problem List   Diagnosis Date Noted  . Pleural effusion on left 12/07/2015  . Wheezing on auscultation 12/04/2015  . Elevated blood pressure reading without diagnosis of hypertension 12/04/2015  . Upper respiratory infection 12/04/2015  . Obesity 03/31/2015    History reviewed. No pertinent surgical history.  Prior to Admission medications   Medication Sig Start Date End Date Taking? Authorizing Provider  mometasone (ELOCON) 0.1 % cream Apply 1 application topically daily. 03/01/16   Ellyn Hack, MD  Multiple Vitamin (MULTIVITAMIN) tablet Take 1 tablet by mouth daily.    [provider]  ondansetron (ZOFRAN ODT) 4 MG disintegrating tablet Take 1 tablet (4 mg total) by mouth every 8 (eight) hours as needed for nausea or vomiting. 10/17/16   Irean Hong, MD  oxyCODONE-acetaminophen (ROXICET) 5-325 MG tablet Take 1 tablet by mouth every 4 (four) hours as needed for severe pain. 10/17/16   Irean Hong, MD  tamsulosin (FLOMAX) 0.4 MG CAPS capsule Take 1 capsule (0.4 mg total) by mouth daily. 10/17/16   Irean Hong, MD    Allergies Patient has no known allergies.  Family History  Problem Relation Age of Onset  . Scleroderma Mother   . Aneurysm Father   . Hypertension Maternal Grandmother   . Cerebral aneurysm Father     Social History Social History  Substance Use Topics  . Smoking status: Passive Smoke Exposure - Never Smoker  . Smokeless tobacco: Never Used  . Alcohol use No    Review of Systems  Constitutional: No fever/chills. Eyes: No visual changes. ENT: No sore throat. Cardiovascular: Denies chest pain. Respiratory: Denies shortness of breath. Gastrointestinal: Positive for left flank and abdominal pain.  No nausea, no vomiting.  No diarrhea.  No constipation. Genitourinary: Negative for dysuria. Musculoskeletal: Negative for back pain. Skin: Negative for rash. Neurological: Negative for headaches, focal weakness or numbness.   ____________________________________________   PHYSICAL EXAM:  VITAL SIGNS: ED Triage Vitals  Enc Vitals Group     BP 10/17/16 0218 (!) 195/94     Pulse Rate 10/17/16 0217 73     Resp 10/17/16 0217 16     Temp 10/17/16 0217 98.1 F (36.7 C)     Temp Source 10/17/16 0217 Oral     SpO2 10/17/16 0217 100 %     Weight 10/17/16 0217 189 lb (85.7 kg)     Height --      Head Circumference --      Peak Flow --      Pain Score --      Pain Loc --  Pain Edu? --      Excl. in GC? --     Constitutional: Alert and oriented. Well appearing and in no acute distress. Eyes: Conjunctivae are normal. PERRL. EOMI. Head: Atraumatic. Nose: No congestion/rhinnorhea. Mouth/Throat: Mucous membranes are moist.  Oropharynx non-erythematous. Neck: No stridor.   Cardiovascular: Normal rate, regular rhythm. Grossly normal heart sounds.  Good peripheral circulation. Respiratory: Normal respiratory effort.  No retractions. Lungs CTAB. Gastrointestinal: Soft and nontender to light or deep palpation. No distention. No abdominal bruits. No CVA  tenderness. Musculoskeletal: No lower extremity tenderness nor edema.  No joint effusions. Neurologic:  Normal speech and language. No gross focal neurologic deficits are appreciated. No gait instability. Skin:  Skin is warm, dry and intact. No rash noted. Psychiatric: Mood and affect are normal. Speech and behavior are normal.  ____________________________________________   LABS (all labs ordered are listed, but only abnormal results are displayed)  Labs Reviewed  COMPREHENSIVE METABOLIC PANEL - Abnormal; Notable for the following:       Result Value   Potassium 3.4 (*)    Glucose, Bld 130 (*)    All other components within normal limits  URINALYSIS, COMPLETE (UACMP) WITH MICROSCOPIC - Abnormal; Notable for the following:    Color, Urine STRAW (*)    APPearance CLEAR (*)    Hgb urine dipstick LARGE (*)    Squamous Epithelial / LPF 0-5 (*)    All other components within normal limits  LIPASE, BLOOD  CBC  TROPONIN I  POC URINE PREG, ED  POCT PREGNANCY, URINE   ____________________________________________  EKG  None ____________________________________________  RADIOLOGY  Ct Renal Stone Study  Result Date: 10/17/2016 CLINICAL DATA:  Acute onset of upper left-sided abdominal pain. Initial encounter. EXAM: CT ABDOMEN AND PELVIS WITHOUT CONTRAST TECHNIQUE: Multidetector CT imaging of the abdomen and pelvis was performed following the standard protocol without IV contrast. COMPARISON:  None. FINDINGS: Lower chest: The visualized lung bases are grossly clear. The visualized portions of the mediastinum are unremarkable. Hepatobiliary: The liver is unremarkable in appearance. The gallbladder is unremarkable in appearance. The common bile duct remains normal in caliber. Pancreas: The pancreas is within normal limits. Spleen: The spleen is unremarkable in appearance. Adrenals/Urinary Tract: The adrenal glands are grossly unremarkable. Minimal left-sided hydronephrosis is noted, with  prominence of the left ureter along most of its course. An obstructing 4 x 3 mm stone is noted at the distal left ureter, 4 cm above the left vesicoureteral junction. Scattered nonobstructing left renal stones measure up to 7 mm in size. Smaller right renal stones are seen. No significant perinephric stranding is seen. Stomach/Bowel: The stomach is unremarkable in appearance. The small bowel is within normal limits. The appendix is normal in caliber, without evidence of appendicitis. The colon is unremarkable in appearance. Vascular/Lymphatic: The abdominal aorta is unremarkable in appearance. The inferior vena cava is grossly unremarkable. No retroperitoneal lymphadenopathy is seen. No pelvic sidewall lymphadenopathy is identified. Reproductive: The bladder is mildly distended and grossly remarkable. The uterus is unremarkable in appearance. A 3.9 cm left adnexal cystic focus is noted. The right ovary is unremarkable in appearance. No additional suspicious adnexal masses are seen. Other: No additional soft tissue abnormalities are seen. Musculoskeletal: No acute osseous abnormalities are identified. The visualized musculature is unremarkable in appearance. IMPRESSION: 1. Minimal left-sided hydronephrosis, with an obstructing 4 x 3 mm stone at the distal left ureter, 4 cm above the left vesicoureteral junction. 2. Scattered nonobstructing left renal stones measure up to 7 mm  in size. Smaller right renal stones seen. 3. 3.9 cm left adnexal cystic focus is more likely physiologic in nature, though pelvic ultrasound would be helpful for further evaluation, when and as deemed clinically appropriate. Electronically Signed   By: Roanna RaiderJeffery  Chang M.D.   On: 10/17/2016 04:44    ____________________________________________   PROCEDURES  Procedure(s) performed: None  Procedures  Critical Care performed: No  ____________________________________________   INITIAL IMPRESSION / ASSESSMENT AND PLAN / ED  COURSE  Pertinent labs & imaging results that were available during my care of the patient were reviewed by me and considered in my medical decision making (see chart for details).  56 year old female who presents with left flank to abdominal pain, resolved with Tylenol. Prior history of kidney stones. Laboratory and urinalysis results remarkable for TNTC rbc's which is suspicious for kidney stones. Discussed with patient and will proceed with CT renal colic study.  Clinical Course as of Oct 18 515  Thu Oct 17, 2016  [id number] Updated patient of CT imaging results. Patient voices no complaints of pain. Strict return precautions given. Patient verbalizes understanding and agrees with plan of care.  [JS]    Clinical Course User Index [JS] Irean HongSung, Jade J, MD     ____________________________________________   FINAL CLINICAL IMPRESSION(S) / ED DIAGNOSES  Final diagnoses:  Kidney stones  Cyst of left ovary      NEW MEDICATIONS STARTED DURING THIS VISIT:  Discharge Medication List as of 10/17/2016  4:55 AM    START taking these medications   Details  ondansetron (ZOFRAN ODT) 4 MG disintegrating tablet Take 1 tablet (4 mg total) by mouth every 8 (eight) hours as needed for nausea or vomiting., Starting Thu 10/17/2016, Print    oxyCODONE-acetaminophen (ROXICET) 5-325 MG tablet Take 1 tablet by mouth every 4 (four) hours as needed for severe pain., Starting Thu 10/17/2016, Print    tamsulosin (FLOMAX) 0.4 MG CAPS capsule Take 1 capsule (0.4 mg total) by mouth daily., Starting Thu 10/17/2016, Print         Note:  This document was prepared using Dragon voice recognition software and may include unintentional dictation errors.    Irean HongSung, Jade J, MD 10/17/16 [phone]

## 2017-05-12 ENCOUNTER — Encounter: Payer: No Typology Code available for payment source | Admitting: Family Medicine

## 2017-05-13 ENCOUNTER — Encounter: Payer: Self-pay | Admitting: Family Medicine

## 2017-05-13 ENCOUNTER — Ambulatory Visit (INDEPENDENT_AMBULATORY_CARE_PROVIDER_SITE_OTHER): Payer: No Typology Code available for payment source | Admitting: Family Medicine

## 2017-05-13 VITALS — BP 120/82 | HR 87 | Temp 97.9°F | Resp 16 | Ht 67.0 in | Wt 202.5 lb

## 2017-05-13 DIAGNOSIS — E6609 Other obesity due to excess calories: Secondary | ICD-10-CM | POA: Diagnosis not present

## 2017-05-13 DIAGNOSIS — Z6831 Body mass index (BMI) 31.0-31.9, adult: Secondary | ICD-10-CM

## 2017-05-13 DIAGNOSIS — Z131 Encounter for screening for diabetes mellitus: Secondary | ICD-10-CM | POA: Diagnosis not present

## 2017-05-13 DIAGNOSIS — Z124 Encounter for screening for malignant neoplasm of cervix: Secondary | ICD-10-CM | POA: Diagnosis not present

## 2017-05-13 DIAGNOSIS — G4726 Circadian rhythm sleep disorder, shift work type: Secondary | ICD-10-CM

## 2017-05-13 DIAGNOSIS — Z1211 Encounter for screening for malignant neoplasm of colon: Secondary | ICD-10-CM

## 2017-05-13 DIAGNOSIS — Z114 Encounter for screening for human immunodeficiency virus [HIV]: Secondary | ICD-10-CM | POA: Diagnosis not present

## 2017-05-13 DIAGNOSIS — Z1231 Encounter for screening mammogram for malignant neoplasm of breast: Secondary | ICD-10-CM

## 2017-05-13 DIAGNOSIS — Z1239 Encounter for other screening for malignant neoplasm of breast: Secondary | ICD-10-CM

## 2017-05-13 DIAGNOSIS — Z01419 Encounter for gynecological examination (general) (routine) without abnormal findings: Secondary | ICD-10-CM | POA: Diagnosis not present

## 2017-05-13 DIAGNOSIS — Z8639 Personal history of other endocrine, nutritional and metabolic disease: Secondary | ICD-10-CM | POA: Diagnosis not present

## 2017-05-13 DIAGNOSIS — Z1159 Encounter for screening for other viral diseases: Secondary | ICD-10-CM

## 2017-05-13 DIAGNOSIS — Z1212 Encounter for screening for malignant neoplasm of rectum: Secondary | ICD-10-CM

## 2017-05-13 DIAGNOSIS — Z113 Encounter for screening for infections with a predominantly sexual mode of transmission: Secondary | ICD-10-CM

## 2017-05-13 DIAGNOSIS — Z13220 Encounter for screening for lipoid disorders: Secondary | ICD-10-CM | POA: Diagnosis not present

## 2017-05-13 NOTE — Progress Notes (Signed)
Name: Gloria Fuller   MRN: [id number]    DOB: 14-Feb-1961   Date:05/13/2017       Progress Note  Subjective  Chief Complaint  Chief Complaint  Patient presents with  . Annual Exam    HPI   Patient presents for annual CPE w/ Pap and fasting labs; she is new to me today and has not been seen in our clinic since November 2017.  Diet: Breakfast - banana, boiled egg, toast, cereal, etc.; meal before work (works 3rd shift) - vegetables, starch, and a meat (usually chicken).  Usually eats some fruit in the afternoon. Exercise: Goes to the Gym about 3 times a week - usually does the bike and treadmill, also does some ab workouts.  USPSTF grade A and B recommendations  Depression: Negative Depression screen [facility] 2/9 05/13/2017  Decreased Interest 0  Down, Depressed, Hopeless 0  PHQ - 2 Score 0   Hypertension: Normal today BP Readings from Last 3 Encounters:  05/13/17 120/82  10/17/16 (!) 143/90  03/01/16 124/70   Obesity: Discussed diet and exercise.  We will check labs today. Wt Readings from Last 3 Encounters:  05/13/17 202 lb 8 oz (91.9 kg)  10/17/16 189 lb (85.7 kg)  03/01/16 189 lb 6.4 oz (85.9 kg)   BMI Readings from Last 3 Encounters:  05/13/17 31.72 kg/m  10/17/16 29.60 kg/m  03/01/16 29.66 kg/m    Alcohol: Does not drink at all Tobacco use: Never smoker/never user HIV, hep C: We will check today STD testing and prevention (chl/gon/syphilis): We will check today; has had one new partner in last year. Intimate partner violence: Currently separated x2 years; has been feeling great with her separation; no concerns for IPV.  Sexual History/Pain during Intercourse:  Is currently sexually active - monogamous with a new partner; no pain with intercourse. Menstrual History/LMP/Abnormal Bleeding: Postmenopausal x2 years. No vaginal bleeding, no vaginal discharge. Incontinence Symptoms: Denies incontinence symptoms.   Advanced Care Planning: A voluntary discussion about  advance care planning including the explanation and discussion of advance directives.  Discussed health care proxy and Living will, and the patient does not have a proxy at this time.  Patient does not have a living will at present time. If patient does have living will, I have requested they bring this to the clinic to be scanned in to their chart.  Breast cancer: We will order Mammogram today; No family or personal history of breast cancer No results found for: HMMAMMO  BRCA gene screening: Does not qualify Cervical cancer screening: We will check today  Osteoporosis: We will check vitamin D today; no family history of osteoporosis   No results found for: HMDEXASCAN  Fall prevention/vitamin D:  We will check vitamin D today. Lipids: We will check today. No results found for: CHOL No results found for: HDL No results found for: LDLCALC No results found for: TRIG No results found for: CHOLHDL No results found for: LDLDIRECT  Glucose: We will check fasting today. Glucose  Date Value Ref Range Status  07/15/2011 75 65 - 99 mg/dL Final   Glucose, Bld  Date Value Ref Range Status  10/17/2016 130 (H) 65 - 99 mg/dL Final   Skin cancer: No concerning moles or lesions; no family or personal history of skin cancer Colorectal cancer: We will order today.  Denies changes in stool - no blood in stool, dark and tarry stools, mucus in stool, abdominal pain. Lung cancer:  Does not qualify Aspirin: We will check lipids  today to determine if needed. ECG: Denies chest pain/shortness of breath.  Patient Active Problem List   Diagnosis Date Noted  . Pleural effusion on left 12/07/2015  . Wheezing on auscultation 12/04/2015  . Elevated blood pressure reading without diagnosis of hypertension 12/04/2015  . Upper respiratory infection 12/04/2015  . Obesity 03/31/2015    No past surgical history on file.  Family History  Problem Relation Age of Onset  . Scleroderma Mother   . Aneurysm Father    . Hypertension Maternal Grandmother   . Cerebral aneurysm Father     Social History   Socioeconomic History  . Marital status: Married    Spouse name: Not on file  . Number of children: Not on file  . Years of education: Not on file  . Highest education level: Not on file  Social Needs  . Financial resource strain: Not on file  . Food insecurity - worry: Not on file  . Food insecurity - inability: Not on file  . Transportation needs - medical: Not on file  . Transportation needs - non-medical: Not on file  Occupational History  . Not on file  Tobacco Use  . Smoking status: Passive Smoke Exposure - Never Smoker  . Smokeless tobacco: Never Used  Substance and Sexual Activity  . Alcohol use: No  . Drug use: No  . Sexual activity: Yes    Partners: Male  Other Topics Concern  . Not on file  Social History Narrative   ** Merged History Encounter **         Current Outpatient Medications:  .  mometasone (ELOCON) 0.1 % cream, Apply 1 application topically daily. (Patient not taking: Reported on 05/13/2017), Disp: 45 g, Rfl: 0 .  Multiple Vitamin (MULTIVITAMIN) tablet, Take 1 tablet by mouth daily., Disp: , Rfl:  .  ondansetron (ZOFRAN ODT) 4 MG disintegrating tablet, Take 1 tablet (4 mg total) by mouth every 8 (eight) hours as needed for nausea or vomiting. (Patient not taking: Reported on 05/13/2017), Disp: 30 tablet, Rfl: 0 .  oxyCODONE-acetaminophen (ROXICET) 5-325 MG tablet, Take 1 tablet by mouth every 4 (four) hours as needed for severe pain. (Patient not taking: Reported on 05/13/2017), Disp: 30 tablet, Rfl: 0 .  tamsulosin (FLOMAX) 0.4 MG CAPS capsule, Take 1 capsule (0.4 mg total) by mouth daily. (Patient not taking: Reported on 05/13/2017), Disp: 14 capsule, Rfl: 0  No Known Allergies   ROS  Constitutional: Negative for fever or weight change.  Respiratory: Negative for cough and shortness of breath.   Cardiovascular: Negative for chest pain or palpitations.   Gastrointestinal: Negative for abdominal pain, no bowel changes.  Musculoskeletal: Negative for gait problem or joint swelling.  Skin: Negative for rash.  Neurological: Negative for dizziness or headache.  No other specific complaints in a complete review of systems (except as listed in HPI above).   Objective  Vitals:   05/13/17 0909  BP: 120/82  Pulse: 87  Resp: 16  Temp: 97.9 F (36.6 C)  TempSrc: Oral  SpO2: 93%  Weight: 202 lb 8 oz (91.9 kg)  Height: '5\' 7"'  (1.702 m)    Body mass index is 31.72 kg/m.  Physical Exam  Constitutional: Patient appears well-developed and well-nourished. Obese. No distress.  HENT: Head: Normocephalic and atraumatic. Ears: B TMs ok, no erythema or effusion; Nose: Nose normal. Mouth/Throat: Oropharynx is clear and moist. No oropharyngeal exudate.  Eyes: Conjunctivae and EOM are normal. Pupils are equal, round, and reactive to light. No scleral  icterus.  Neck: Normal range of motion. Neck supple. No JVD present. No thyromegaly present.  Cardiovascular: Normal rate, regular rhythm and normal heart sounds.  No murmur heard. No BLE edema. No carotid bruit. Pulmonary/Chest: Effort normal and breath sounds normal. No respiratory distress. Abdominal: Soft. Bowel sounds are normal, no distension. There is no tenderness. no masses Breast: no lumps or masses, no nipple discharge or rashes FEMALE GENITALIA:  External genitalia normal External urethra normal Vaginal vault normal without discharge or lesions Cervix normal without discharge or lesions Bimanual exam normal without masses, no CMT. RECTAL: deferred Musculoskeletal: Normal range of motion, no joint effusions. No gross deformities Neurological: she is alert and oriented to person, place, and time. No cranial nerve deficit. Coordination, balance, strength, speech and gait are normal.  Skin: Skin is warm and dry. No rash noted. No erythema.  Psychiatric: Patient has a normal mood and affect.  behavior is normal. Judgment and thought content normal.  No results found for this or any previous visit (from the past 2160 hour(s)).  PHQ2/9: Depression screen PHQ 2/9 05/13/2017  Decreased Interest 0  Down, Depressed, Hopeless 0  PHQ - 2 Score 0   Fall Risk: Fall Risk  05/13/2017  Falls in the past year? No   Assessment & Plan  1. Well woman exam with routine gynecological exam -USPSTF grade A and B recommendations reviewed with patient; age-appropriate recommendations, preventive care, screening tests, etc discussed and encouraged; healthy living encouraged; see AVS for patient education given to patient -Discussed importance of 150 minutes of physical activity weekly, eat two servings of fish weekly, eat one serving of tree nuts ( cashews, pistachios, pecans, almonds.Marland Kitchen) every other day, eat 6 servings of fruit/vegetables daily and drink plenty of water and avoid sweet beverages.  - Health Maintenance: Clinic is out of TDAP today, will plan to provide at follow up.  Declines flu shot. Hep C screening, HIV screening, Mammogram, pap, and colonoscopy orders are placed.  2. Screening for colorectal cancer - Ambulatory referral to Gastroenterology  3. Breast cancer screening - MM DIGITAL SCREENING BILATERAL; Future  4. Need for hepatitis C screening test - Hepatitis C antibody  5. Encounter for screening for HIV - HIV antibody  6. Cervical cancer screening - Pap IG, CT/NG NAA, and HPV (high risk)  7. Class 1 obesity due to excess calories without serious comorbidity with body mass index (BMI) of 31.0 to 31.9 in adult - Lipid panel - COMPLETE METABOLIC PANEL WITH GFR - TSH  8. Screening for hyperlipidemia - Lipid panel  9. Diabetes mellitus screening - COMPLETE METABOLIC PANEL WITH GFR  10. Shifting sleep-work schedule - Discussed at length - nutrition, sleep patterns, healthy exercise routine.  She has been getting enough sleep and is exercising regularly. I do recommend  she increase her meals to three meals a day and 1-2 healthy snacks.  11. History of vitamin D deficiency - VITAMIN D 25 Hydroxy (Vit-D Deficiency, Fractures)  12. Screen for STD (sexually transmitted disease) - HIV antibody - RPR - Pap IG, CT/NG NAA, and HPV (high risk)   Return for 1-2 mos for regular follow up with new PCP.

## 2017-05-13 NOTE — Patient Instructions (Addendum)
Preventive Care 40-64 Years, Female Preventive care refers to lifestyle choices and visits with your health care provider that can promote health and wellness. What does preventive care include?  A yearly physical exam. This is also called an annual well check.  Dental exams once or twice a year.  Routine eye exams. Ask your health care provider how often you should have your eyes checked.  Personal lifestyle choices, including: ? Daily care of your teeth and gums. ? Regular physical activity. ? Eating a healthy diet. ? Avoiding tobacco and drug use. ? Limiting alcohol use. ? Practicing safe sex. ? Taking low-dose aspirin daily starting at age 58. ? Taking vitamin and mineral supplements as recommended by your health care provider. What happens during an annual well check? The services and screenings done by your health care provider during your annual well check will depend on your age, overall health, lifestyle risk factors, and family history of disease. Counseling Your health care provider may ask you questions about your:  Alcohol use.  Tobacco use.  Drug use.  Emotional well-being.  Home and relationship well-being.  Sexual activity.  Eating habits.  Work and work Statistician.  Method of birth control.  Menstrual cycle.  Pregnancy history.  Screening You may have the following tests or measurements:  Height, weight, and BMI.  Blood pressure.  Lipid and cholesterol levels. These may be checked every 5 years, or more frequently if you are over 81 years old.  Skin check.  Lung cancer screening. You may have this screening every year starting at age 78 if you have a 30-pack-year history of smoking and currently smoke or have quit within the past 15 years.  Fecal occult blood test (FOBT) of the stool. You may have this test every year starting at age 65.  Flexible sigmoidoscopy or colonoscopy. You may have a sigmoidoscopy every 5 years or a colonoscopy  every 10 years starting at age 30.  Hepatitis C blood test.  Hepatitis B blood test.  Sexually transmitted disease (STD) testing.  Diabetes screening. This is done by checking your blood sugar (glucose) after you have not eaten for a while (fasting). You may have this done every 1-3 years.  Mammogram. This may be done every 1-2 years. Talk to your health care provider about when you should start having regular mammograms. This may depend on whether you have a family history of breast cancer.  BRCA-related cancer screening. This may be done if you have a family history of breast, ovarian, tubal, or peritoneal cancers.  Pelvic exam and Pap test. This may be done every 3 years starting at age 80. Starting at age 36, this may be done every 5 years if you have a Pap test in combination with an HPV test.  Bone density scan. This is done to screen for osteoporosis. You may have this scan if you are at high risk for osteoporosis.  Discuss your test results, treatment options, and if necessary, the need for more tests with your health care provider. Vaccines Your health care provider may recommend certain vaccines, such as:  Influenza vaccine. This is recommended every year.  Tetanus, diphtheria, and acellular pertussis (Tdap, Td) vaccine. You may need a Td booster every 10 years.  Varicella vaccine. You may need this if you have not been vaccinated.  Zoster vaccine. You may need this after age 5.  Measles, mumps, and rubella (MMR) vaccine. You may need at least one dose of MMR if you were born in  1957 or later. You may also need a second dose.  Pneumococcal 13-valent conjugate (PCV13) vaccine. You may need this if you have certain conditions and were not previously vaccinated.  Pneumococcal polysaccharide (PPSV23) vaccine. You may need one or two doses if you smoke cigarettes or if you have certain conditions.  Meningococcal vaccine. You may need this if you have certain  conditions.  Hepatitis A vaccine. You may need this if you have certain conditions or if you travel or work in places where you may be exposed to hepatitis A.  Hepatitis B vaccine. You may need this if you have certain conditions or if you travel or work in places where you may be exposed to hepatitis B.  Haemophilus influenzae type b (Hib) vaccine. You may need this if you have certain conditions.  Talk to your health care provider about which screenings and vaccines you need and how often you need them. This information is not intended to replace advice given to you by your health care provider. Make sure you discuss any questions you have with your health care provider. Document Released: 04/28/2015 Document Revised: 12/20/2015 Document Reviewed: 01/31/2015 Elsevier Interactive Patient Education  2018 [employer]  

## 2017-05-14 ENCOUNTER — Other Ambulatory Visit: Payer: Self-pay | Admitting: Family Medicine

## 2017-05-14 DIAGNOSIS — E559 Vitamin D deficiency, unspecified: Secondary | ICD-10-CM

## 2017-05-14 LAB — LIPID PANEL
CHOL/HDL RATIO: 1.7 (calc) (ref <5.0)
CHOLESTEROL: 116 mg/dL (ref <200)
HDL: 69 mg/dL (ref >50)
LDL CHOLESTEROL (CALC): 33 mg/dL
Non-HDL Cholesterol (Calc): 47 mg/dL (calc) (ref <130)
Triglycerides: 60 mg/dL (ref <150)

## 2017-05-14 LAB — COMPLETE METABOLIC PANEL WITH GFR
AG Ratio: 1.1 (calc) (ref 1.0–2.5)
ALKALINE PHOSPHATASE (APISO): 68 U/L (ref 33–130)
ALT: 17 U/L (ref 6–29)
AST: 15 U/L (ref 10–35)
Albumin: 3.8 g/dL (ref 3.6–5.1)
BUN: 14 mg/dL (ref 7–25)
CO2: 30 mmol/L (ref 20–32)
CREATININE: 0.78 mg/dL (ref 0.50–1.05)
Calcium: 9.6 mg/dL (ref 8.6–10.4)
Chloride: 106 mmol/L (ref 98–110)
GFR, EST NON AFRICAN AMERICAN: 85 mL/min/{1.73_m2} (ref >=60)
GFR, Est African American: 98 mL/min/{1.73_m2} (ref >=60)
GLOBULIN: 3.4 g/dL (ref 1.9–3.7)
Glucose, Bld: 83 mg/dL (ref 65–99)
POTASSIUM: 4 mmol/L (ref 3.5–5.3)
SODIUM: 140 mmol/L (ref 135–146)
Total Bilirubin: 0.6 mg/dL (ref 0.2–1.2)
Total Protein: 7.2 g/dL (ref 6.1–8.1)

## 2017-05-14 LAB — HEPATITIS C ANTIBODY
Hepatitis C Ab: NONREACTIVE
SIGNAL TO CUT-OFF: 0.02 (ref <1.00)

## 2017-05-14 LAB — VITAMIN D 25 HYDROXY (VIT D DEFICIENCY, FRACTURES): VIT D 25 HYDROXY: 12 ng/mL — AB (ref 30–100)

## 2017-05-14 LAB — RPR: RPR Ser Ql: NONREACTIVE

## 2017-05-14 LAB — HIV ANTIBODY (ROUTINE TESTING W REFLEX): HIV: NONREACTIVE

## 2017-05-14 LAB — TSH: TSH: 0.77 m[IU]/L (ref 0.40–4.50)

## 2017-05-14 MED ORDER — VITAMIN D (ERGOCALCIFEROL) 1.25 MG (50000 UNIT) PO CAPS: 50000.0000 [IU] | ORAL_CAPSULE | ORAL | 0 refills | Status: DC

## 2017-05-16 ENCOUNTER — Other Ambulatory Visit: Payer: Self-pay

## 2017-05-16 ENCOUNTER — Telehealth: Payer: Self-pay | Admitting: Family Medicine

## 2017-05-16 DIAGNOSIS — E559 Vitamin D deficiency, unspecified: Secondary | ICD-10-CM

## 2017-05-16 NOTE — Telephone Encounter (Signed)
Copied from CRM [id number]. Topic: Quick Communication - Rx Refill/Question >> May 16, 2017  8:44 AM Landry MellowFoltz, Melissa J wrote: Medication: vitamin d    Has the patient contacted their pharmacy? Yes.     (Agent: If no, request that the patient contact the pharmacy for the refill.)   Preferred Pharm[facility] (with phone number [facility] ** rx was sent to the wrong pharmacy - [facility] **   Agent: Please be advised that RX refills may take up to 3 business days. We ask that you follow-up with your pharmacy.

## 2017-05-16 NOTE — Telephone Encounter (Signed)
Called left message for [facility] to [facility] to have rx transferred.

## 2017-05-19 ENCOUNTER — Telehealth: Payer: Self-pay | Admitting: Emergency Medicine

## 2017-05-19 DIAGNOSIS — E559 Vitamin D deficiency, unspecified: Secondary | ICD-10-CM

## 2017-05-19 LAB — PAP IG, CT-NG NAA, HPV HIGH-RISK
C. TRACHOMATIS RNA, TMA: NOT DETECTED
HPV DNA High Risk: DETECTED — AB
N. GONORRHOEAE RNA, TMA: NOT DETECTED

## 2017-05-19 MED ORDER — VITAMIN D (ERGOCALCIFEROL) 1.25 MG (50000 UNIT) PO CAPS: 50000.0000 [IU] | ORAL_CAPSULE | ORAL | 0 refills | Status: DC

## 2017-05-19 NOTE — Addendum Note (Signed)
Addended by: Doren CustardBOYCE, Ellery Tash E on: 05/19/2017 04:42 PM   Modules accepted: Orders

## 2017-05-19 NOTE — Telephone Encounter (Signed)
Patient notified of pap results. Would like her scripts [facility] instead of [facility]. Copied from CRM [phone]. Topic: General - Other >> May 19, 2017  2:44 PM [name], Angela L wrote: Reason for CRM: patient calling stating that someone had called her from this practice [facility] or lmom it looks like someone documented that they notified pt about labs pt states that she haven't spoken with anyone

## 2017-05-19 NOTE — Telephone Encounter (Signed)
Vitamin D [facility] to [facility].

## 2017-05-29 ENCOUNTER — Other Ambulatory Visit: Payer: Self-pay

## 2017-05-29 ENCOUNTER — Telehealth: Payer: Self-pay

## 2017-05-29 DIAGNOSIS — Z1211 Encounter for screening for malignant neoplasm of colon: Secondary | ICD-10-CM

## 2017-05-29 NOTE — Telephone Encounter (Signed)
Gastroenterology Pre-Procedure Review  Request Date: Requesting Physician: Dr.    PATIENT REVIEW QUESTIONS: The patient responded to the following health history questions as indicated:    1. Are you having any GI issues? No  2. Do you have a personal history of Polyps? No  3. Do you have a family history of Colon Cancer or Polyps? No  4. Diabetes Mellitus? No  5. Joint replacements in the past 12 months? No  6. Major health problems in the past 3 months? No  7. Any artificial heart valves, MVP, or defibrillator? No     MEDICATIONS & ALLERGIES:    Patient reports the following regarding taking any anticoagulation/antiplatelet therapy:   Plavix, Coumadin, Eliquis, Xarelto, Lovenox, Pradaxa, Brilinta, or Effient? No  Aspirin? No   Patient confirms/reports the following medications:  Current Outpatient Medications  Medication Sig Dispense Refill  . Multiple Vitamin (MULTIVITAMIN) tablet Take 1 tablet by mouth daily.    . Vitamin D, Ergocalciferol, (DRISDOL) 50000 units CAPS capsule Take 1 capsule (50,000 Units total) by mouth every 7 (seven) days. For 8 weeks ONLY. 8 capsule 0   No current facility-administered medications for this visit.     Patient confirms/reports the following allergies:  No Known Allergies  No orders of the defined types were placed in this encounter.   AUTHORIZATION INFORMATION Primary Insurance: 1D#: Group #:  Secondary Insurance: 1D#: Group #:  SCHEDULE INFORMATION: Date: 06/16/17 Time: Location: [facility]

## 2017-06-16 ENCOUNTER — Ambulatory Visit: Admission: RE | Admit: 2017-06-16 | Payer: Self-pay | Source: Ambulatory Visit | Admitting: Gastroenterology

## 2017-06-16 ENCOUNTER — Encounter: Admission: RE | Payer: Self-pay | Source: Ambulatory Visit

## 2017-06-16 SURGERY — COLONOSCOPY WITH PROPOFOL
Anesthesia: General

## 2017-07-23 ENCOUNTER — Telehealth: Payer: Self-pay | Admitting: Nurse Practitioner

## 2017-07-23 NOTE — Telephone Encounter (Signed)
[facility] for [facility]

## 2017-07-23 NOTE — Telephone Encounter (Signed)
Copied from CRM [phone]. Topic: Referral - Request >> Jul 22, 2017 12:03 PM Eston Mouldavis, Cheri B wrote: Reason for CRM: PT would like a referral to get the mammogram  >> Jul 22, 2017  3:14 PM Riesa PopeJeffries, Helen, RMA wrote: Please put in referral for mammogram and do patient need to come back for Vitamin D levels

## 2017-11-27 ENCOUNTER — Other Ambulatory Visit: Payer: Self-pay

## 2017-11-27 ENCOUNTER — Emergency Department
Admission: EM | Admit: 2017-11-27 | Discharge: 2017-11-27 | Disposition: A | Discharge status: Home / Self Care | Payer: No Typology Code available for payment source | Attending: Emergency Medicine | Admitting: Emergency Medicine

## 2017-11-27 ENCOUNTER — Encounter: Payer: Self-pay | Admitting: Emergency Medicine

## 2017-11-27 ENCOUNTER — Emergency Department: Payer: No Typology Code available for payment source

## 2017-11-27 DIAGNOSIS — Z79899 Other long term (current) drug therapy: Secondary | ICD-10-CM | POA: Diagnosis not present

## 2017-11-27 DIAGNOSIS — Z7722 Contact with and (suspected) exposure to environmental tobacco smoke (acute) (chronic): Secondary | ICD-10-CM | POA: Diagnosis not present

## 2017-11-27 DIAGNOSIS — J45909 Unspecified asthma, uncomplicated: Secondary | ICD-10-CM | POA: Insufficient documentation

## 2017-11-27 DIAGNOSIS — R109 Unspecified abdominal pain: Secondary | ICD-10-CM | POA: Diagnosis present

## 2017-11-27 DIAGNOSIS — N200 Calculus of kidney: Secondary | ICD-10-CM | POA: Insufficient documentation

## 2017-11-27 LAB — CBC
HEMATOCRIT: 40 % (ref 35.0–47.0)
Hemoglobin: 13.3 g/dL (ref 12.0–16.0)
MCH: 30.4 pg (ref 26.0–34.0)
MCHC: 33.2 g/dL (ref 32.0–36.0)
MCV: 91.7 fL (ref 80.0–100.0)
Platelets: 251 10*3/uL (ref 150–440)
RBC: 4.36 MIL/uL (ref 3.80–5.20)
RDW: 13.2 % (ref 11.5–14.5)
WBC: 6 10*3/uL (ref 3.6–11.0)

## 2017-11-27 LAB — URINALYSIS, COMPLETE (UACMP) WITH MICROSCOPIC
Bilirubin Urine: NEGATIVE
Glucose, UA: NEGATIVE mg/dL
KETONES UR: NEGATIVE mg/dL
Leukocytes, UA: NEGATIVE
NITRITE: NEGATIVE
PH: 6 (ref 5.0–8.0)
Protein, ur: NEGATIVE mg/dL
RBC / HPF: >50 RBC/hpf — ABNORMAL HIGH (ref 0–5)
Specific Gravity, Urine: 1.014 (ref 1.005–1.030)

## 2017-11-27 LAB — COMPREHENSIVE METABOLIC PANEL
ALBUMIN: 3.9 g/dL (ref 3.5–5.0)
ALK PHOS: 72 U/L (ref 38–126)
ALT: 19 U/L (ref 0–44)
AST: 20 U/L (ref 15–41)
Anion gap: 4 — ABNORMAL LOW (ref 5–15)
BILIRUBIN TOTAL: 0.6 mg/dL (ref 0.3–1.2)
BUN: 20 mg/dL (ref 6–20)
CALCIUM: 9.9 mg/dL (ref 8.9–10.3)
CO2: 31 mmol/L (ref 22–32)
Chloride: 106 mmol/L (ref 98–111)
Creatinine, Ser: 0.87 mg/dL (ref 0.44–1.00)
GFR calc Af Amer: >60 mL/min (ref >60)
GFR calc non Af Amer: >60 mL/min (ref >60)
GLUCOSE: 117 mg/dL — AB (ref 70–99)
POTASSIUM: 4 mmol/L (ref 3.5–5.1)
Sodium: 141 mmol/L (ref 135–145)
TOTAL PROTEIN: 7.8 g/dL (ref 6.5–8.1)

## 2017-11-27 MED ORDER — ONDANSETRON HCL 4 MG PO TABS: 4.0000 mg | ORAL_TABLET | Freq: Three times a day (TID) | ORAL | 0 refills | Status: DC | PRN

## 2017-11-27 MED ORDER — TAMSULOSIN HCL 0.4 MG PO CAPS: 0.4000 mg | ORAL_CAPSULE | Freq: Every day | ORAL | 0 refills | Status: DC

## 2017-11-27 MED ORDER — KETOROLAC TROMETHAMINE 30 MG/ML IJ SOLN
30.0000 mg | Freq: Once | INTRAMUSCULAR | Status: AC
  Administered 2017-11-27: 30 mg via INTRAVENOUS
  Filled 2017-11-27: qty 1

## 2017-11-27 MED ORDER — ONDANSETRON HCL 4 MG/2ML IJ SOLN
4.0000 mg | Freq: Once | INTRAMUSCULAR | Status: AC
  Administered 2017-11-27: 4 mg via INTRAVENOUS
  Filled 2017-11-27: qty 2

## 2017-11-27 MED ORDER — KETOROLAC TROMETHAMINE 10 MG PO TABS: 10.0000 mg | ORAL_TABLET | Freq: Three times a day (TID) | ORAL | 0 refills | Status: DC | PRN

## 2017-11-27 MED ORDER — MORPHINE SULFATE (PF) 4 MG/ML IV SOLN
4.0000 mg | Freq: Once | INTRAVENOUS | Status: AC
  Administered 2017-11-27: 4 mg via INTRAVENOUS
  Filled 2017-11-27: qty 1

## 2017-11-27 NOTE — ED Provider Notes (Signed)
[facility] Emergency Department Provider Note  ____________________________________________   I have reviewed the triage vital signs and the nursing notes.   HISTORY  Chief Complaint Flank Pain and Abdominal Pain   History limited by: Not Limited   HPI Gloria Fuller is a 57 y.o. female who presents to the emergency department today because of concern for left flank pain. Started yesterday. Started suddenly. Has radiated to the groin. The pain was accompanied by some nausea. It reminded her of her previous episode of kidney stone. She denies any fevers. Has not noticed any painful urination or bad odor to her urine.    Per medical record review patient has a history of asthma.   Past Medical History:  Diagnosis Date  . Asthma   . Elevated blood pressure     Patient Active Problem List   Diagnosis Date Noted  . Vitamin D deficiency 05/14/2017  . Shifting sleep-work schedule 05/13/2017  . History of vitamin D deficiency 05/13/2017  . Pleural effusion on left 12/07/2015  . Wheezing on auscultation 12/04/2015  . Elevated blood pressure reading without diagnosis of hypertension 12/04/2015  . Upper respiratory infection 12/04/2015  . Obesity 03/31/2015    History reviewed. No pertinent surgical history.  Prior to Admission medications   Medication Sig Start Date End Date Taking? Authorizing Provider  Multiple Vitamin (MULTIVITAMIN) tablet Take 1 tablet by mouth daily.    [provider]  Vitamin D, Ergocalciferol, (DRISDOL) 50000 units CAPS capsule Take 1 capsule (50,000 Units total) by mouth every 7 (seven) days. For 8 weeks ONLY. 05/19/17   Doren CustardBoyce, Emily E, FNP    Allergies Patient has no known allergies.  Family History  Problem Relation Age of Onset  . Scleroderma Mother   . Aneurysm Father   . Hypertension Maternal Grandmother   . Cerebral aneurysm Father     Social History Social History   Tobacco Use  . Smoking status:  Passive Smoke Exposure - Never Smoker  . Smokeless tobacco: Never Used  Substance Use Topics  . Alcohol use: No  . Drug use: No    Review of Systems Constitutional: No fever/chills Eyes: No visual changes. ENT: No sore throat. Cardiovascular: Denies chest pain. Respiratory: Denies shortness of breath. Gastrointestinal: Positive for left flank pain.  Genitourinary: Negative for dysuria. Musculoskeletal: Negative for back pain. Skin: Negative for rash. Neurological: Negative for headaches, focal weakness or numbness.  ____________________________________________   PHYSICAL EXAM:  VITAL SIGNS: ED Triage Vitals [11/27/17 0027]  Enc Vitals Group     BP (!) 185/86     Pulse Rate 64     Resp 18     Temp 97.7 F (36.5 C)     Temp Source Oral     SpO2 100 %     Weight 178 lb (80.7 kg)     Height 5\' 7"  (1.702 m)     Head Circumference      Peak Flow      Pain Score 8    Constitutional: Alert and oriented.  Eyes: Conjunctivae are normal.  ENT      Head: Normocephalic and atraumatic.      Nose: No congestion/rhinnorhea.      Mouth/Throat: Mucous membranes are moist.      Neck: No stridor. Hematological/Lymphatic/Immunilogical: No cervical lymphadenopathy. Cardiovascular: Normal rate, regular rhythm.  No murmurs, rubs, or gallops.  Respiratory: Normal respiratory effort without tachypnea nor retractions. Breath sounds are clear and equal bilaterally. No wheezes/rales/rhonchi. Gastrointestinal: Soft and  minimally tender in the left lower quadrant. No rebound. No guarding.  Genitourinary: Deferred Musculoskeletal: Normal range of motion in all extremities. No lower extremity edema. Neurologic:  Normal speech and language. No gross focal neurologic deficits are appreciated.  Skin:  Skin is warm, dry and intact. No rash noted. Psychiatric: Mood and affect are normal. Speech and behavior are normal. Patient exhibits appropriate insight and  judgment.  ____________________________________________    LABS (pertinent positives/negatives)  CBC wbc 6.0, hgb 13.3, plt 251 CMP wnl except glu 117, anion gap 4 UA large urine dipstick, >50 RBC, 0-5 wbc  ____________________________________________   EKG  None  ____________________________________________    RADIOLOGY  CT renal stone 6mm stone in left uvj   ____________________________________________   PROCEDURES  Procedures  ____________________________________________   INITIAL IMPRESSION / ASSESSMENT AND PLAN / ED COURSE  Pertinent labs & imaging results that were available during my care of the patient were reviewed by me and considered in my medical decision making (see chart for details).   Patient presented to the emergency department today because of concerns for left flank pain.  Work-up is consistent with a kidney stone.  At this point no concerns for infected kidney stone.  Patient did feel better after medication.  Discussed findings with patient.  Will discharge home.  ____________________________________________   FINAL CLINICAL IMPRESSION(S) / ED DIAGNOSES  Final diagnoses:  Kidney stone     Note: This dictation was prepared with Dragon dictation. Any transcriptional errors that result from this process are unintentional     Phineas SemenGoodman, Nimrat Woolworth, MD 11/27/17 [phone]

## 2017-11-27 NOTE — Discharge Instructions (Signed)
Please seek medical attention for any high fevers, chest pain, shortness of breath, change in behavior, persistent vomiting, bloody stool or any other new or concerning symptoms.  

## 2017-11-27 NOTE — ED Notes (Signed)
Lab results reviewed. Awaiting room for MD eval.  

## 2017-11-27 NOTE — ED Notes (Signed)
AAOx3.  Skin warm and dry.  NAD 

## 2017-11-27 NOTE — ED Triage Notes (Addendum)
Pt presents to ED with left sided flank pain that radiates to her left lower abd. Pt states she was seen last July for similar symptoms and was dx with kidney stones. Denies any urinary symptoms. Pt alert and talkative at this time. Denies n/v. Onset of symptoms around 1600 but improved after taking tylenol.

## 2017-12-11 ENCOUNTER — Ambulatory Visit
Admission: RE | Admit: 2017-12-11 | Discharge: 2017-12-11 | Disposition: A | Discharge status: Home / Self Care | Payer: No Typology Code available for payment source | Source: Ambulatory Visit | Attending: Urology | Admitting: Urology

## 2017-12-11 ENCOUNTER — Ambulatory Visit (INDEPENDENT_AMBULATORY_CARE_PROVIDER_SITE_OTHER): Payer: No Typology Code available for payment source | Admitting: Urology

## 2017-12-11 ENCOUNTER — Encounter: Payer: Self-pay | Admitting: Urology

## 2017-12-11 VITALS — BP 150/82 | HR 91 | Ht 67.0 in | Wt 205.0 lb

## 2017-12-11 DIAGNOSIS — N202 Calculus of kidney with calculus of ureter: Secondary | ICD-10-CM | POA: Diagnosis not present

## 2017-12-11 DIAGNOSIS — N201 Calculus of ureter: Secondary | ICD-10-CM | POA: Diagnosis not present

## 2017-12-11 DIAGNOSIS — N200 Calculus of kidney: Secondary | ICD-10-CM

## 2017-12-12 ENCOUNTER — Telehealth: Payer: Self-pay

## 2017-12-12 DIAGNOSIS — N200 Calculus of kidney: Secondary | ICD-10-CM

## 2017-12-12 LAB — MICROSCOPIC EXAMINATION

## 2017-12-12 LAB — URINALYSIS, COMPLETE
BILIRUBIN UA: NEGATIVE
GLUCOSE, UA: NEGATIVE
KETONES UA: NEGATIVE
Nitrite, UA: NEGATIVE
PH UA: 5.5 (ref 5.0–7.5)
PROTEIN UA: NEGATIVE
SPEC GRAV UA: 1.02 (ref 1.005–1.030)
UUROB: 0.2 mg/dL (ref 0.2–1.0)

## 2017-12-12 NOTE — Telephone Encounter (Signed)
Called pt informed her of the information below. Pt gave verbal understanding. KUB order placed.

## 2017-12-12 NOTE — Telephone Encounter (Signed)
-----   Message from Riki AltesScott C Stoioff, MD sent at 12/12/2017 11:24 AM EDT ----- KUB was reviewed.  Her upper ureteral stone is now in the distal ureter.  If he desires a trial of passage would recommend a repeat KUB in 1 week.

## 2017-12-14 ENCOUNTER — Encounter: Payer: Self-pay | Admitting: Urology

## 2017-12-14 NOTE — Progress Notes (Signed)
12/11/2017 9:33 AM   Tinika Sela Hilding Aug 14, 1960 [id number]  Referring provider: Ellyn Hack, MD [address] [place], [place] [address]  Chief Complaint  Patient presents with  . Nephrolithiasis    HPI: 57 year old female presented to the [facility] ED on 11/27/2017 with acute onset of left flank pain.  She had radiation of the pain to the left lower quadrant.  There were no identifiable precipitating, aggravating or alleviating factors.  The severity was rated 8/10.  She denied fever, chills, nausea or vomiting.  A stone protocol CT of the abdomen pelvis was performed which showed a 6 mm left proximal ureteral calculus with moderate hydronephrosis/hydroureter.  There were multiple nonobstructing left renal calculi measuring up to 8 mm.  There were punctate nonobstructing right renal calculi.  Her pain was controlled with parenteral analgesics and she was discharged on oral ketorolac, Zofran and tamsulosin.  Since her ED visit her pain has been minimal and she has been asymptomatic for the past 3 days.  She was seen in the ED in July 2018 for left renal colic with a 4 mm left distal ureteral calculus which she subsequently passed.  There is no history of chronic bowel disease or previous intestinal surgery.   PMH: Past Medical History:  Diagnosis Date  . Arthritis   . Asthma   . Elevated blood pressure     Surgical History: No past surgical history on file.  Home Medications:  Allergies as of 12/11/2017   No Known Allergies     Medication List        Accurate as of 12/11/17 11:59 PM. Always use your most recent med list.          ketorolac 10 MG tablet Commonly known as:  TORADOL Take 1 tablet (10 mg total) by mouth every 8 (eight) hours as needed for severe pain.   multivitamin tablet Take 1 tablet by mouth daily.   ondansetron 4 MG tablet Commonly known as:  ZOFRAN Take 1 tablet (4 mg total) by mouth every 8 (eight) hours as needed for nausea or  vomiting.   tamsulosin 0.4 MG Caps capsule Commonly known as:  FLOMAX Take 1 capsule (0.4 mg total) by mouth daily.   Vitamin D (Ergocalciferol) 50000 units Caps capsule Commonly known as:  DRISDOL Take 1 capsule (50,000 Units total) by mouth every 7 (seven) days. For 8 weeks ONLY.       Allergies: No Known Allergies  Family History: Family History  Problem Relation Age of Onset  . Scleroderma Mother   . Aneurysm Father   . Hypertension Maternal Grandmother   . Cerebral aneurysm Father     Social History:  reports that she is a non-smoker but has been exposed to tobacco smoke. She has never used smokeless tobacco. She reports that she does not drink alcohol or use drugs.  ROS: UROLOGY Frequent Urination?: Yes Hard to postpone urination?: No Burning/pain with urination?: No Get up at night to urinate?: Yes Leakage of urine?: No Urine stream starts and stops?: No Trouble starting stream?: No Do you have to strain to urinate?: No Blood in urine?: No Urinary tract infection?: No Sexually transmitted disease?: No Injury to kidneys or bladder?: No Painful intercourse?: No Weak stream?: No Currently pregnant?: No Vaginal bleeding?: No Last menstrual period?: Postmenopausal  Gastrointestinal Nausea?: No Vomiting?: No Indigestion/heartburn?: Yes Diarrhea?: Yes Constipation?: Yes  Constitutional Fever: Yes Night sweats?: No Weight loss?: No Fatigue?: No  Skin Skin rash/lesions?: No Itching?:  No  Eyes Blurred vision?: No Double vision?: No  Ears/Nose/Throat Sore throat?: No Sinus problems?: Yes  Hematologic/Lymphatic Swollen glands?: No Easy bruising?: Yes  Cardiovascular Leg swelling?: No Chest pain?: No  Respiratory Cough?: No Shortness of breath?: No  Endocrine Excessive thirst?: No  Musculoskeletal Back pain?: No Joint pain?: No  Neurological Headaches?: No Dizziness?: No  Psychologic Depression?: No Anxiety?: No  Physical  Exam: BP (!) 150/82 (BP Location: Left Arm, Patient Position: Sitting, Cuff Size: Large)   Pulse 91   Ht 5\' 7"  (1.702 m)   Wt 205 lb (93 kg)   LMP 09/03/2015   BMI 32.11 kg/m   Constitutional:  Alert and oriented, No acute distress. HEENT: [place] AT, moist mucus membranes.  Trachea midline, no masses. Cardiovascular: No clubbing, cyanosis, or edema. Respiratory: Normal respiratory effort, no increased work of breathing. GI: Abdomen is soft, nontender, nondistended, no abdominal masses GU: No CVA tenderness Lymph: No cervical or inguinal lymphadenopathy. Skin: No rashes, bruises or suspicious lesions. Neurologic: Grossly intact, no focal deficits, moving all 4 extremities. Psychiatric: Normal mood and affect.  Laboratory Data:  Urinalysis Dipstick trace blood/1+ leukocytes Microscopy negative  Pertinent Imaging: CT was personally reviewed  Results for orders placed during the hospital encounter of 11/27/17  CT Renal Stone Study   Narrative CLINICAL DATA:  Left flank pain radiating to the left lower abdomen. Previous history of kidney stones.  EXAM: CT ABDOMEN AND PELVIS WITHOUT CONTRAST  TECHNIQUE: Multidetector CT imaging of the abdomen and pelvis was performed following the standard protocol without IV contrast.  COMPARISON:  10/17/2016  FINDINGS: Lower chest: Motion artifact limits evaluation. Lung bases appear clear.  Hepatobiliary: No focal liver abnormality is seen. No gallstones, gallbladder wall thickening, or biliary dilatation.  Pancreas: Unremarkable. No pancreatic ductal dilatation or surrounding inflammatory changes.  Spleen: Normal in size without focal abnormality.  Adrenals/Urinary Tract: Multiple stones in both kidneys. Largest stone is in the left lower pole measuring 8 mm diameter. There is a 6 mm stone in the left ureteropelvic junction with mild proximal hydronephrosis, swelling of the left kidney, and mild perinephric and periureteral  stranding. Ureters are decompressed. Bladder is unremarkable.  Stomach/Bowel: Stomach is within normal limits. Appendix appears normal. No evidence of bowel wall thickening, distention, or inflammatory changes.  Vascular/Lymphatic: No significant vascular findings are present. No enlarged abdominal or pelvic lymph nodes.  Reproductive: Cyst on the left ovary measuring 4.1 cm appears to be a simple cyst and likely is functional. Similar appearance to previous study. Uterus and right ovary are unremarkable.  Other: No free air or free fluid in the abdomen. Abdominal wall musculature appears intact.  Musculoskeletal: No acute or significant osseous findings.  IMPRESSION: 1. 6 mm stone in the left ureteropelvic junction with moderate proximal obstruction. Additional nonobstructing stones in both kidneys.  2. 4.1 cm left ovarian cyst, similar to previous study, likely benign. Consider ultrasound for additional characterization.   Electronically Signed   By: Burman Nieves M.D.   On: 11/27/2017 02:25     Assessment & Plan:   1.  Left proximal ureteral calculus-she has a 6 mm stone and was informed based on size and location she may not be able to pass spontaneously.  A KUB was ordered to see if there has been any stone progression.  Management options were discussed including shockwave lithotripsy and ureteroscopy.  The advantage of ureteroscopy would be her nonobstructing left renal calculi could also be addressed.  She would like to think over  these options and will be notified with her KUB results.  2.  Bilateral nephrolithiasis -Would recommend a metabolic evaluation after current episode has resolved.   Riki Altes, MD  [facility] [address], [address] [place], [place] [address] [phone]

## 2018-04-16 ENCOUNTER — Telehealth: Payer: Self-pay

## 2018-04-16 NOTE — Telephone Encounter (Signed)
Yes - I prefer to evaluate first.  Lumps can be a variety of different things, so we need to determine if mammogram vs [place] is best. Thanks!

## 2018-04-16 NOTE — Telephone Encounter (Signed)
Copied from CRM [id number]. Topic: General - Other >> Apr 16, 2018  3:05 PM Darletta Moll L wrote: Reason for CRM: Patient needs a mammo. Noticed a lump in right breast. Says the order for the mammo needs to be the kind to see if the lump is a concern. Please advise.

## 2018-04-17 ENCOUNTER — Other Ambulatory Visit: Payer: Self-pay | Admitting: Family Medicine

## 2018-04-17 ENCOUNTER — Encounter: Payer: Self-pay | Admitting: Family Medicine

## 2018-04-17 ENCOUNTER — Ambulatory Visit (INDEPENDENT_AMBULATORY_CARE_PROVIDER_SITE_OTHER): Payer: No Typology Code available for payment source | Admitting: Family Medicine

## 2018-04-17 VITALS — BP 144/96 | HR 103 | Temp 98.4°F | Resp 18 | Ht 67.0 in | Wt 204.0 lb

## 2018-04-17 DIAGNOSIS — N6310 Unspecified lump in the right breast, unspecified quadrant: Secondary | ICD-10-CM

## 2018-04-17 NOTE — Progress Notes (Signed)
Name: Gloria Fuller   MRN: [id number]    DOB: [birth year]   Date:04/17/2018       Progress Note  Subjective  Chief Complaint  Chief Complaint  Patient presents with  . Breast Mass    Onset-2 weeks ago, right breast while doing a breast exam at time-it is hard knot on her right about 9 o'clock, no pain associated with the lump    HPI  PT presents with concern for RIGHT breast lump.  She is overdue for mammogram - this was ordered at last CPE in January 2019, but she never got around to going.  2 weeks ago she noticed a lump in the RIGHT medial breast. Denies pain, no itching, redness.  No nipple shape changes, no nipple discharge.   Patient Active Problem List   Diagnosis Date Noted  . Vitamin D deficiency 05/14/2017  . Shifting sleep-work schedule 05/13/2017  . History of vitamin D deficiency 05/13/2017  . Pleural effusion on left 12/07/2015  . Wheezing on auscultation 12/04/2015  . Elevated blood pressure reading without diagnosis of hypertension 12/04/2015  . Upper respiratory infection 12/04/2015  . Obesity 03/31/2015    Social History   Tobacco Use  . Smoking status: Passive Smoke Exposure - Never Smoker  . Smokeless tobacco: Never Used  Substance Use Topics  . Alcohol use: No     Current Outpatient Medications:  Marland Kitchen  Multiple Vitamin (MULTIVITAMIN) tablet, Take 1 tablet by mouth daily., Disp: , Rfl:  .  ketorolac (TORADOL) 10 MG tablet, Take 1 tablet (10 mg total) by mouth every 8 (eight) hours as needed for severe pain. (Patient not taking: Reported on 04/17/2018), Disp: 20 tablet, Rfl: 0 .  ondansetron (ZOFRAN) 4 MG tablet, Take 1 tablet (4 mg total) by mouth every 8 (eight) hours as needed for nausea or vomiting. (Patient not taking: Reported on 04/17/2018), Disp: 20 tablet, Rfl: 0 .  tamsulosin (FLOMAX) 0.4 MG CAPS capsule, Take 1 capsule (0.4 mg total) by mouth daily. (Patient not taking: Reported on 04/17/2018), Disp: 14 capsule, Rfl: 0 .  Vitamin D, Ergocalciferol,  (DRISDOL) 50000 units CAPS capsule, Take 1 capsule (50,000 Units total) by mouth every 7 (seven) days. For 8 weeks ONLY. (Patient not taking: Reported on 04/17/2018), Disp: 8 capsule, Rfl: 0  No Known Allergies  I personally reviewed active problem list, medication list, allergies with the patient/caregiver today.  ROS  Ten systems reviewed and is negative except as mentioned in HPI.  Objective  Vitals:   04/17/18 0853  BP: (!) 144/96  Pulse: (!) 103  Resp: 18  Temp: 98.4 F (36.9 C)  TempSrc: Oral  SpO2: 98%  Weight: 204 lb (92.5 kg)  Height: 5\' 7"  (1.702 m)   Body mass index is 31.95 kg/m.  Nursing Note and Vital Signs reviewed.  Physical Exam  Constitutional: Patient appears well-developed and well-nourished. No distress.  HENT: Head: Normocephalic and atraumatic. Eyes: Conjunctivae and EOM are normal.  Neck: Normal range of motion. Neck supple. No JVD present. No thyromegaly present.  Cardiovascular: Normal rate, regular rhythm and normal heart sounds.  No murmur heard. No BLE edema. Pulmonary/Chest: Effort normal and breath sounds normal. No respiratory distress. Breast: There is a density of tissue central and slightly medial deep within the breast tissue of the RIGHT breast, no nipple discharge, nipples are not inverted, or rashes. LEFT breast is WNL. Musculoskeletal: Normal range of motion, no joint effusions. No gross deformities Neurological: he is alert and oriented to person,  place, and time. No cranial nerve deficit. Coordination, balance, strength, speech and gait are normal.  Skin: Skin is warm and dry. No rash noted. No erythema.  Psychiatric: Patient has a normal mood and affect. behavior is normal. Judgment and thought content normal.  No results found for this or any previous visit (from the past 72 hour(s)).  Assessment & Plan  1. Lump of right breast - MM Digital Diagnostic Bilat; Future

## 2018-04-23 ENCOUNTER — Ambulatory Visit
Admission: RE | Admit: 2018-04-23 | Discharge: 2018-04-23 | Disposition: A | Discharge status: Home / Self Care | Payer: No Typology Code available for payment source | Source: Ambulatory Visit | Attending: Family Medicine | Admitting: Family Medicine

## 2018-04-23 DIAGNOSIS — N6310 Unspecified lump in the right breast, unspecified quadrant: Secondary | ICD-10-CM

## 2019-01-29 IMAGING — CR DG ABDOMEN 1V
1 series · 2 of 2 positions shown · non-contrast
Comparison: CT abdomen without contrast 11/27/2017.

CLINICAL DATA: Left ureteral stone.

EXAM:
ABDOMEN - 1 VIEW

[Series 1: dg abd 1 view · 0.14mm/px · 2 of 2 slices shown]
[im 1/2]
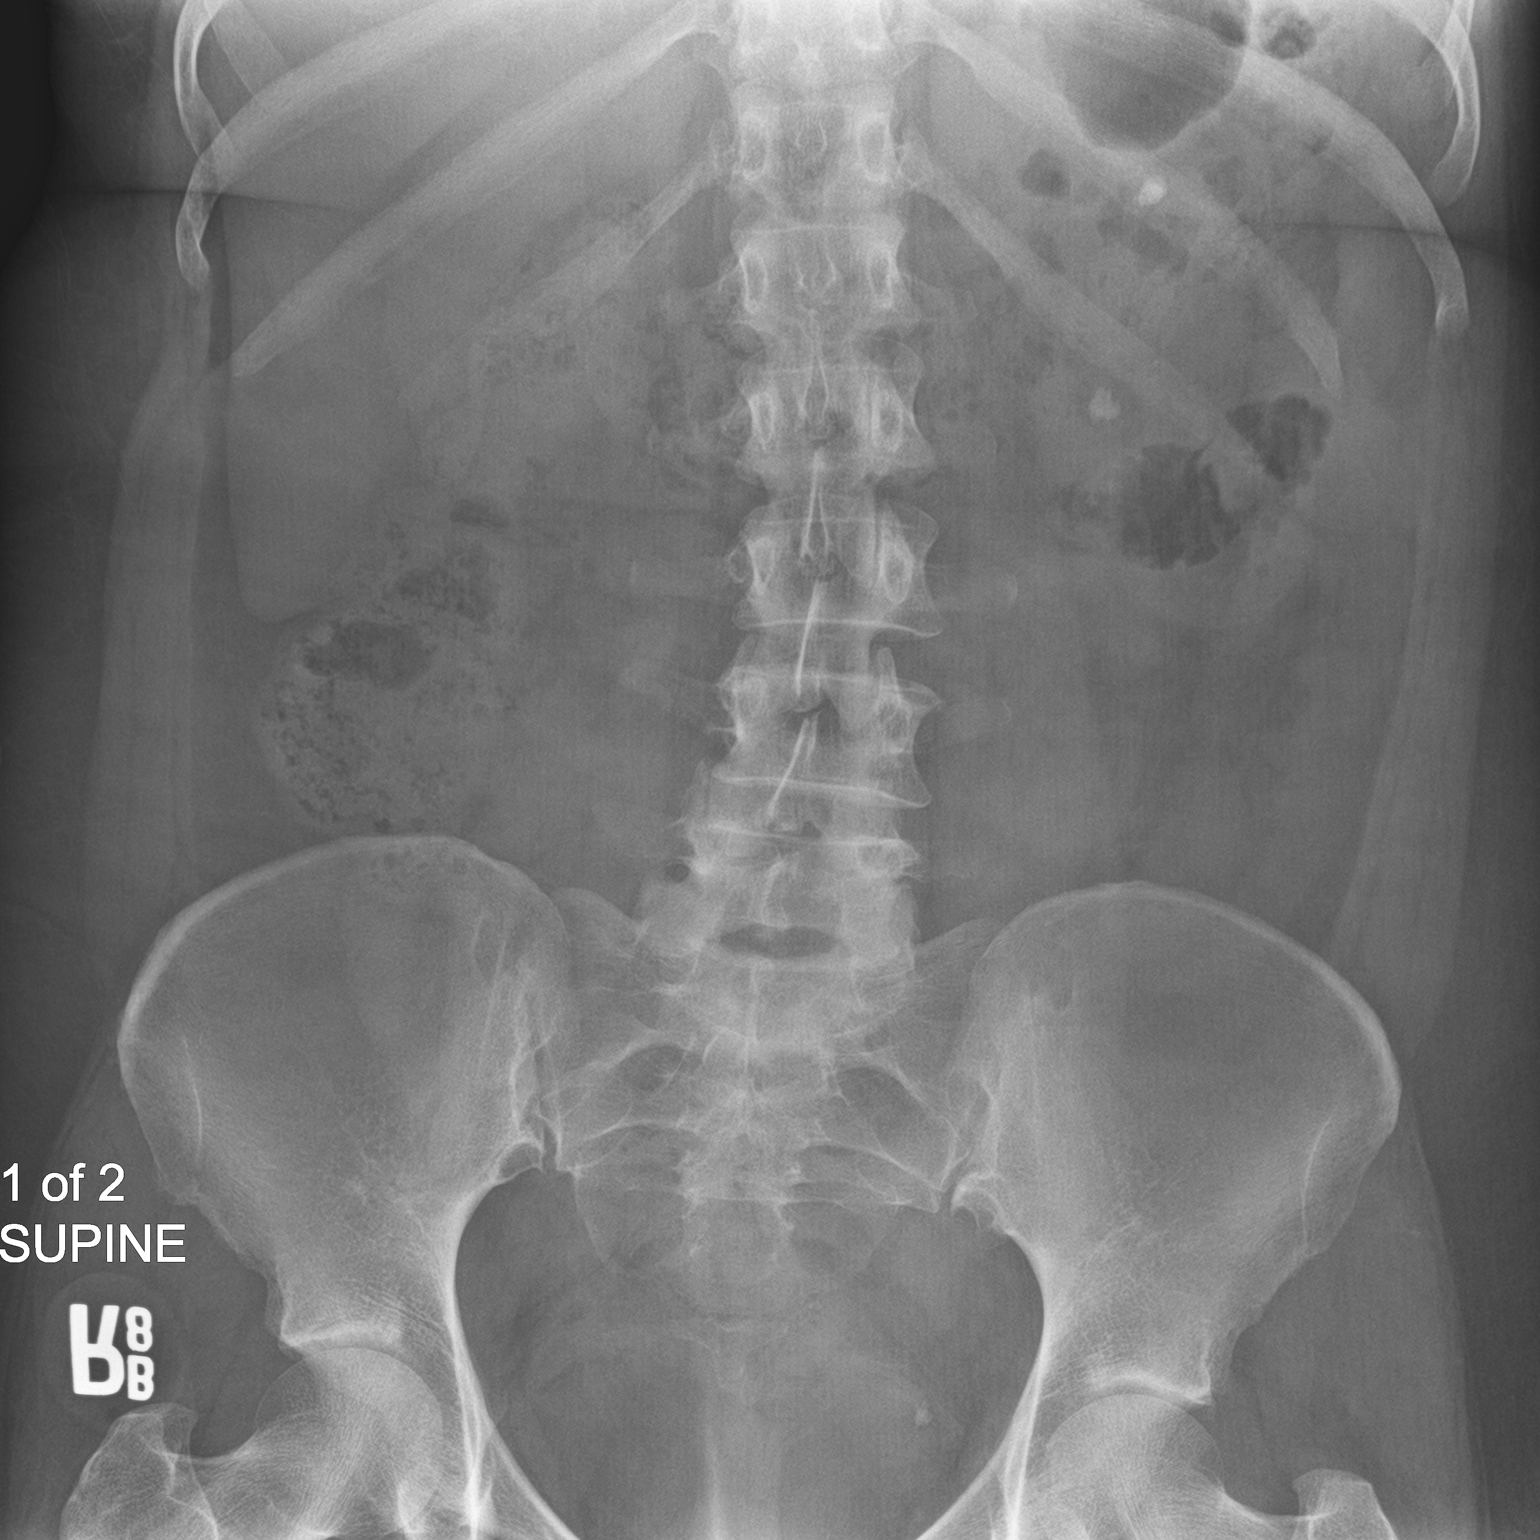
[im 2/2]
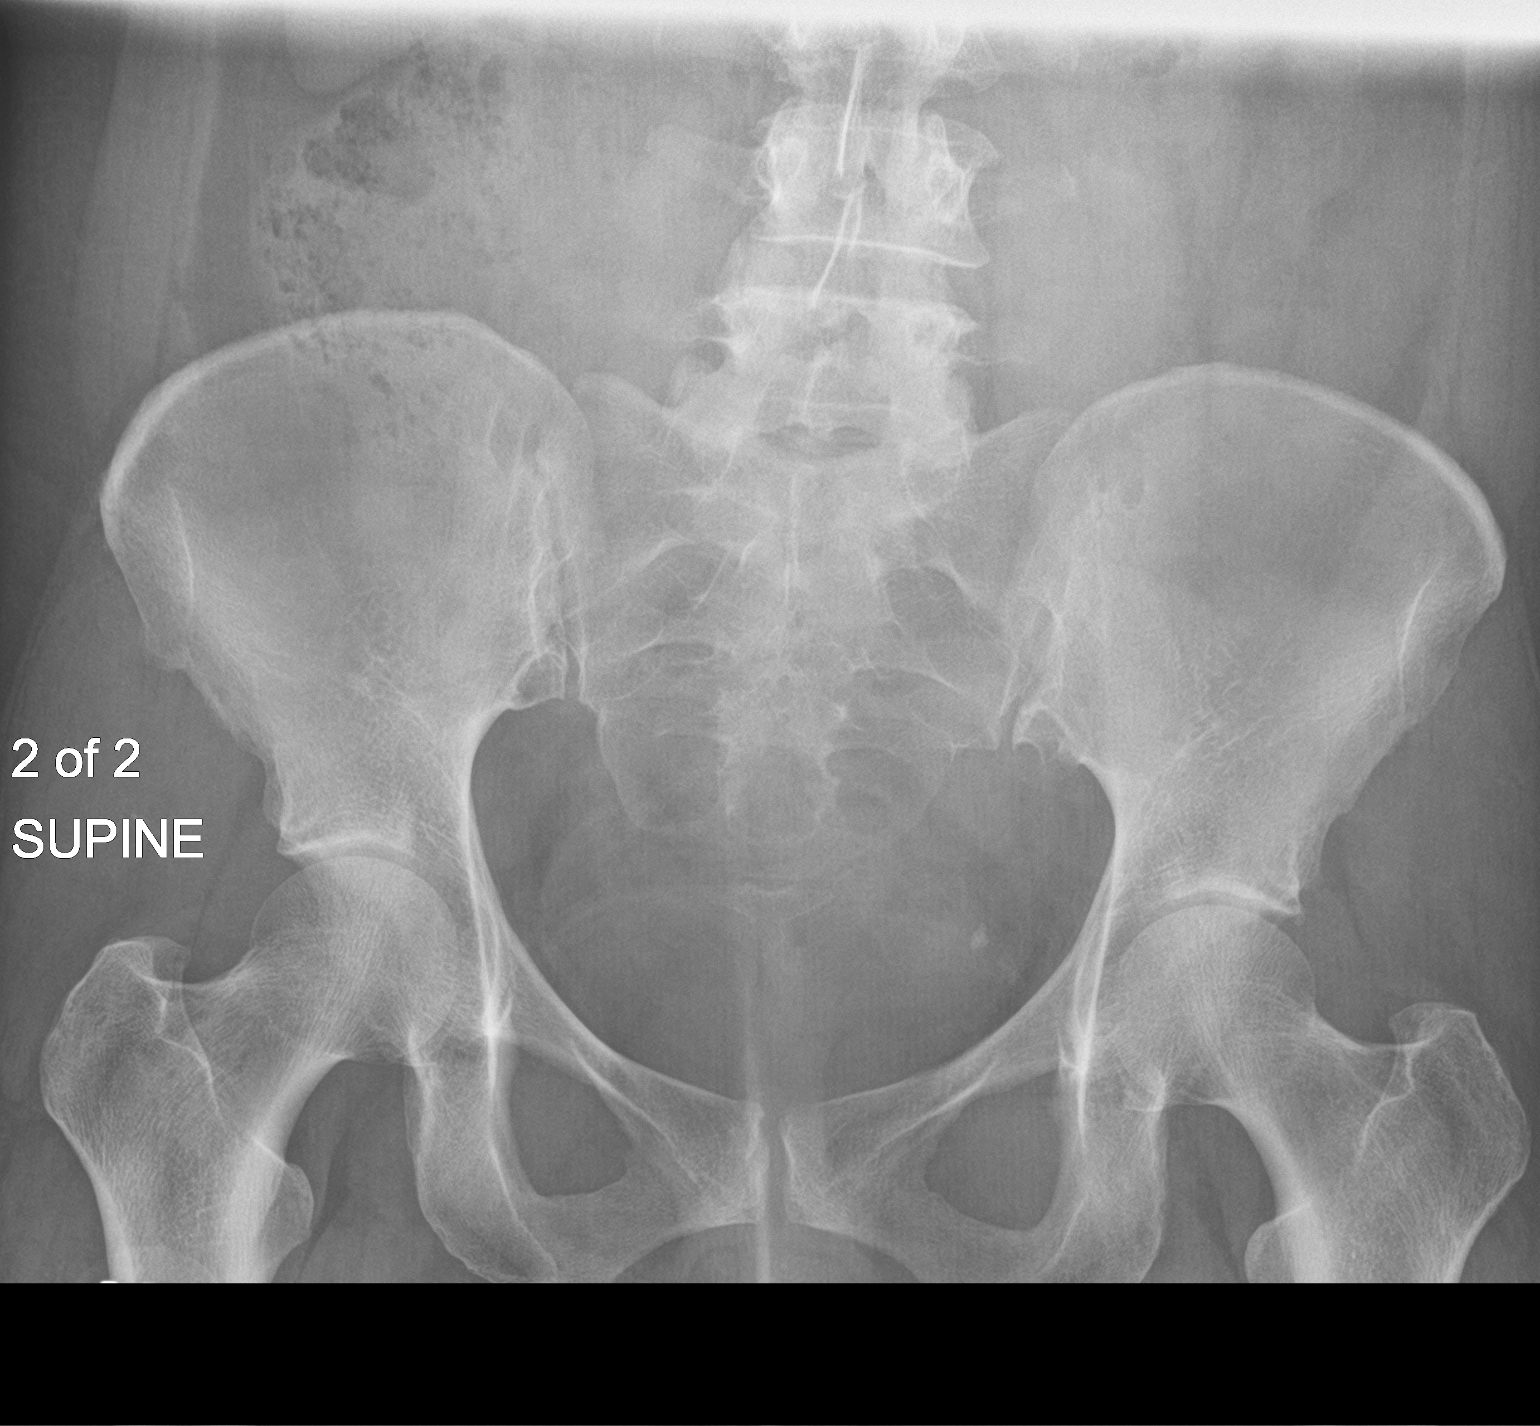

[2 of 2 positions shown; findings below may reference images not displayed]

FINDINGS: Left renal stones measuring up to 9 mm are stable. Previously seen
left proximal ureteral stone now appears to be in the distal left
ureter, projected over the urinary bladder.

Degenerative changes in the lumbar spine are stable.
IMPRESSION: 1. Distal migration of known left ureteral stone, now in the distal
ureter.
2. Larger nonobstructing stones in the left kidney are stable.

## 2019-05-17 ENCOUNTER — Ambulatory Visit (INDEPENDENT_AMBULATORY_CARE_PROVIDER_SITE_OTHER): Payer: No Typology Code available for payment source | Admitting: Family Medicine

## 2019-05-17 ENCOUNTER — Other Ambulatory Visit: Payer: Self-pay

## 2019-05-17 ENCOUNTER — Encounter: Payer: Self-pay | Admitting: Family Medicine

## 2019-05-17 ENCOUNTER — Other Ambulatory Visit (HOSPITAL_COMMUNITY)
Admission: RE | Admit: 2019-05-17 | Discharge: 2019-05-17 | Disposition: A | Discharge status: Home / Self Care | Payer: PRIVATE HEALTH INSURANCE | Source: Ambulatory Visit | Attending: Family Medicine | Admitting: Family Medicine

## 2019-05-17 VITALS — BP 132/84 | HR 98 | Temp 97.8°F | Resp 16 | Ht 67.0 in | Wt 213.6 lb

## 2019-05-17 DIAGNOSIS — Z1329 Encounter for screening for other suspected endocrine disorder: Secondary | ICD-10-CM

## 2019-05-17 DIAGNOSIS — Z124 Encounter for screening for malignant neoplasm of cervix: Secondary | ICD-10-CM | POA: Diagnosis not present

## 2019-05-17 DIAGNOSIS — Z6833 Body mass index (BMI) 33.0-33.9, adult: Secondary | ICD-10-CM

## 2019-05-17 DIAGNOSIS — Z0000 Encounter for general adult medical examination without abnormal findings: Secondary | ICD-10-CM | POA: Diagnosis not present

## 2019-05-17 DIAGNOSIS — Z1231 Encounter for screening mammogram for malignant neoplasm of breast: Secondary | ICD-10-CM

## 2019-05-17 DIAGNOSIS — Z23 Encounter for immunization: Secondary | ICD-10-CM | POA: Diagnosis not present

## 2019-05-17 DIAGNOSIS — Z130 Encounter for screening for diseases of the blood and blood-forming organs and certain disorders involving the immune mechanism: Secondary | ICD-10-CM

## 2019-05-17 DIAGNOSIS — Z1211 Encounter for screening for malignant neoplasm of colon: Secondary | ICD-10-CM

## 2019-05-17 DIAGNOSIS — Z13220 Encounter for screening for lipoid disorders: Secondary | ICD-10-CM

## 2019-05-17 DIAGNOSIS — Z111 Encounter for screening for respiratory tuberculosis: Secondary | ICD-10-CM

## 2019-05-17 DIAGNOSIS — E669 Obesity, unspecified: Secondary | ICD-10-CM

## 2019-05-17 DIAGNOSIS — Z13228 Encounter for screening for other metabolic disorders: Secondary | ICD-10-CM

## 2019-05-17 DIAGNOSIS — E559 Vitamin D deficiency, unspecified: Secondary | ICD-10-CM

## 2019-05-17 NOTE — Patient Instructions (Addendum)
Preventing Osteoporosis, Adult Osteoporosis is a condition that causes the bones to lose density. This means that the bones become thinner, and the normal spaces in bone tissue become larger. Low bone density can make the bones weak and cause them to break more easily. Osteoporosis cannot always be prevented, but you can take steps to lower your risk of developing this condition. How can this condition affect me? If you develop osteoporosis, you will be more likely to break bones in your wrist, spine, or hip. Even a minor accident or injury can be enough to break weak bones. The bones will also be slower to heal. Osteoporosis can cause other problems as well, such as a stooped posture or trouble with movement. Osteoporosis can occur with aging. As you get older, you may lose bone tissue more quickly, or it may be replaced more slowly. Osteoporosis is more likely to develop if you have poor nutrition or do not get enough calcium or vitamin D. Other lifestyle factors can also play a role. By eating a well-balanced diet and making lifestyle changes, you can help keep your bones strong and healthy, lowering your chances of developing osteoporosis. What can increase my risk? The following factors may make you more likely to develop osteoporosis:  Having a family history of the condition.  Having poor nutrition or not getting enough calcium or vitamin D.  Using certain medicines, such as steroid medicines or antiseizure medicines.  Being any of the following: ? 81 years of age or older. ? Female. ? A woman who has gone through menopause (is postmenopausal). ? White (Caucasian) or of Asian descent.  Smoking or having a history of smoking.  Not being physically active (being sedentary).  Having a small body frame. What actions can I take to prevent this?  Get enough calcium   Make sure you get enough calcium every day. Calcium is the most important mineral for bone health. Most people can get  enough calcium from their diet, but supplements may be recommended for people who are at risk for osteoporosis. Follow these guidelines: ? If you are age 31 or younger, aim to get 1,000 mg of calcium every day. ? If you are older than age 96, aim to get 1,200 mg of calcium every day.  Good sources of calcium include: ? Dairy products, such as low-fat or nonfat milk, cheese, and yogurt. ? Dark green leafy vegetables, such as bok choy and broccoli. ? Foods that have had calcium added to them (calcium-fortified foods), such as orange juice, cereal, bread, soy beverages, and tofu products. ? Nuts, such as almonds.  Check nutrition labels to see how much calcium is in a food or drink. Get enough vitamin D  (1000 IU - 2000 IU a day for past deficiency)  Try to get enough vitamin D every day. Vitamin D is the most essential vitamin for bone health. It helps the body absorb calcium. Follow these guidelines for how much vitamin D to get from food: ? If you are age 73 or younger, aim to get at least 600 international units (IU) every day. Your health care provider may suggest more. ? If you are older than age 58, aim to get at least 800 international units every day. Your health care provider may suggest more.  Good sources of vitamin D in your diet include: ? Egg yolks. ? Oily fish, such as salmon, sardines, and tuna. ? Milk and cereal fortified with vitamin D.  Your body also makes vitamin  D when you are out in the sun. Exposing the bare skin on your face, arms, legs, or back to the sun for no more than 30 minutes a day, 2 times a week is more than enough. Beyond that, make sure you use sunblock to protect your skin from sunburn, which increases your risk for skin cancer. Exercise  Stay active and get exercise every day.  Ask your health care provider what types of exercise are best for you. Weight-bearing and strength-building activities are important for building and maintaining healthy bones.  Some examples of these types of activities include: ? Walking and hiking. ? Jogging and running. ? Dancing. ? Gym exercises. ? Lifting weights. ? Tennis and racquetball. ? Climbing stairs. ? Aerobics. Make other lifestyle changes  Do not use any products that contain nicotine or tobacco, such as cigarettes, e-cigarettes, and chewing tobacco. If you need help quitting, ask your health care provider.  Lose weight if you are overweight.  If you drink alcohol: ? Limit how much you use to:  0-1 drink a day for nonpregnant women.  0-2 drinks a day for men. ? Be aware of how much alcohol is in your drink. In the [place] one drink equals one 12 oz bottle of beer (355 mL), one 5 oz glass of wine (148 mL), or one 1 oz glass of hard liquor (44 mL). Where to find support If you need help making changes to prevent osteoporosis, talk with your health care provider. You can ask for a referral to a diet and nutrition specialist (dietitian) and a physical therapist. Where to find more information Learn more about osteoporosis from:  NIH Osteoporosis and Related [place]: www.bones.SouthExposed.es  [place] Office on [employer]: [place].tn  [place]: [employer].ee Summary  Osteoporosis is a condition that causes weak bones that are more likely to break.  Eat a healthy diet, making sure you get enough calcium and vitamin D, and stay active by getting regular exercise to help prevent osteoporosis.  Other ways to reduce your risk of osteoporosis include maintaining a healthy weight and avoiding alcohol and products that contain nicotine or tobacco. This information is not intended to replace advice given to you by your health care provider. Make sure you discuss any questions you have with your health care provider. Document Revised: 10/30/2018 Document Reviewed: 10/30/2018 Elsevier Patient Education  2020 [employer]   Preventive Care  30-94 Years Old, Female Preventive care refers to visits with your health care provider and lifestyle choices that can promote health and wellness. This includes:  A yearly physical exam. This may also be called an annual well check.  Regular dental visits and eye exams.  Immunizations.  Screening for certain conditions.  Healthy lifestyle choices, such as eating a healthy diet, getting regular exercise, not using drugs or products that contain nicotine and tobacco, and limiting alcohol use. What can I expect for my preventive care visit? Physical exam Your health care provider will check your:  Height and weight. This may be used to calculate body mass index (BMI), which tells if you are at a healthy weight.  Heart rate and blood pressure.  Skin for abnormal spots. Counseling Your health care provider may ask you questions about your:  Alcohol, tobacco, and drug use.  Emotional well-being.  Home and relationship well-being.  Sexual activity.  Eating habits.  Work and work Statistician.  Method of birth control.  Menstrual cycle.  Pregnancy history. What immunizations do I  need?  Influenza (flu) vaccine  This is recommended every year. Tetanus, diphtheria, and pertussis (Tdap) vaccine  You may need a Td booster every 10 years. Varicella (chickenpox) vaccine  You may need this if you have not been vaccinated. Zoster (shingles) vaccine  You may need this after age 93. Measles, mumps, and rubella (MMR) vaccine  You may need at least one dose of MMR if you were born in 1957 or later. You may also need a second dose. Pneumococcal conjugate (PCV13) vaccine  You may need this if you have certain conditions and were not previously vaccinated. Pneumococcal polysaccharide (PPSV23) vaccine  You may need one or two doses if you smoke cigarettes or if you have certain conditions. Meningococcal conjugate (MenACWY) vaccine  You may need this if you have certain  conditions. Hepatitis A vaccine  You may need this if you have certain conditions or if you travel or work in places where you may be exposed to hepatitis A. Hepatitis B vaccine  You may need this if you have certain conditions or if you travel or work in places where you may be exposed to hepatitis B. Haemophilus influenzae type b (Hib) vaccine  You may need this if you have certain conditions. Human papillomavirus (HPV) vaccine  If recommended by your health care provider, you may need three doses over 6 months. You may receive vaccines as individual doses or as more than one vaccine together in one shot (combination vaccines). Talk with your health care provider about the risks and benefits of combination vaccines. What tests do I need? Blood tests  Lipid and cholesterol levels. These may be checked every 5 years, or more frequently if you are over 38 years old.  Hepatitis C test.  Hepatitis B test. Screening  Lung cancer screening. You may have this screening every year starting at age 52 if you have a 30-pack-year history of smoking and currently smoke or have quit within the past 15 years.  Colorectal cancer screening. All adults should have this screening starting at age 71 and continuing until age 60. Your health care provider may recommend screening at age 68 if you are at increased risk. You will have tests every 1-10 years, depending on your results and the type of screening test.  Diabetes screening. This is done by checking your blood sugar (glucose) after you have not eaten for a while (fasting). You may have this done every 1-3 years.  Mammogram. This may be done every 1-2 years. Talk with your health care provider about when you should start having regular mammograms. This may depend on whether you have a family history of breast cancer.  BRCA-related cancer screening. This may be done if you have a family history of breast, ovarian, tubal, or peritoneal  cancers.  Pelvic exam and Pap test. This may be done every 3 years starting at age 6. Starting at age 30, this may be done every 5 years if you have a Pap test in combination with an HPV test. Other tests  Sexually transmitted disease (STD) testing.  Bone density scan. This is done to screen for osteoporosis. You may have this scan if you are at high risk for osteoporosis. Follow these instructions at home: Eating and drinking  Eat a diet that includes fresh fruits and vegetables, whole grains, lean protein, and low-fat dairy.  Take vitamin and mineral supplements as recommended by your health care provider.  Do not drink alcohol if: ? Your health care provider tells you  not to drink. ? You are pregnant, may be pregnant, or are planning to become pregnant.  If you drink alcohol: ? Limit how much you have to 0-1 drink a day. ? Be aware of how much alcohol is in your drink. In the [place] one drink equals one 12 oz bottle of beer (355 mL), one 5 oz glass of wine (148 mL), or one 1 oz glass of hard liquor (44 mL). Lifestyle  Take daily care of your teeth and gums.  Stay active. Exercise for at least 30 minutes on 5 or more days each week.  Do not use any products that contain nicotine or tobacco, such as cigarettes, e-cigarettes, and chewing tobacco. If you need help quitting, ask your health care provider.  If you are sexually active, practice safe sex. Use a condom or other form of birth control (contraception) in order to prevent pregnancy and STIs (sexually transmitted infections).  If told by your health care provider, take low-dose aspirin daily starting at age 47. What's next?  Visit your health care provider once a year for a well check visit.  Ask your health care provider how often you should have your eyes and teeth checked.  Stay up to date on all vaccines. This information is not intended to replace advice given to you by your health care provider. Make sure you  discuss any questions you have with your health care provider. Document Revised: 12/11/2017 Document Reviewed: 12/11/2017 Elsevier Patient Education  2020 [employer].

## 2019-05-17 NOTE — Progress Notes (Signed)
Patient: Gloria Fuller, Female    DOB: 16-Apr-1960, 59 y.o.   MRN: [id number] Hubbard Hartshorn, FNP Visit Date: 05/17/2019  Today's Provider: Delsa Grana, PA-C   Chief Complaint  Patient presents with  . Annual Exam    CPE / PAP   Subjective:   Annual physical exam:  Gloria Fuller is a 59 y.o. female who presents today for complete physical exam, PAP and breast exam  Some weight gain over the past year - snacking a little more than normal  Exercise/Activity:  Unable to go to the gym now, needs to do more exercise at home  Diet/nutrition:  Drinking more sweet stuff, snacking a little more Sleep: fairly well, does work third shift, but she feels that she mostly sleeps good  USPSTF grade A and B recommendations - reviewed and addressed today  Depression:  Phq 9 completed today by patient, was reviewed by me with patient in the room, score is  negative, pt feels good, happy PHQ 2/9 Scores 05/17/2019 04/17/2018 05/13/2017  PHQ - 2 Score 0 0 0  PHQ- 9 Score 1 - -   Depression screen [facility] 2/9 05/17/2019 04/17/2018 05/13/2017  Decreased Interest 0 0 0  Down, Depressed, Hopeless 0 0 0  PHQ - 2 Score 0 0 0  Altered sleeping 0 - -  Tired, decreased energy 0 - -  Change in appetite 1 - -  Feeling bad or failure about yourself  0 - -  Trouble concentrating 0 - -  Moving slowly or fidgety/restless 0 - -  Suicidal thoughts 0 - -  PHQ-9 Score 1 - -  Difficult doing work/chores Not difficult at all - -   Alcohol screening:   [facility] Visit from 05/17/2019 in [facility]  AUDIT-C Score  0     Immunizations and Health Maintenance: Health Maintenance  Topic Date Due  . COLONOSCOPY  09/29/2010  . PAP SMEAR-Modifier  05/13/2018  . MAMMOGRAM  04/24/2019  . INFLUENZA VACCINE  07/14/2019 (Originally 11/14/2018)  . TETANUS/TDAP  05/16/2029  . Hepatitis C Screening  Completed  . HIV Screening  Completed     Hep C Screening: done 2019  STD testing and prevention  (HIV/chl/gon/syphilis): HIV done 2019, none needed  Intimate partner violence:  None, denies  Sexual History/Pain during Intercourse:  No sex right now, no concerns Legally Separated  Menstrual History/LMP/Abnormal Bleeding:  none Patient's last menstrual period was 09/03/2015.  Incontinence Symptoms:  none  Breast cancer:  Last Mammogram: 04/23/2018, ordered mammo BRCA gene screening: none  Cervical cancer screening: due for PAP mammon Family hx of cancers - breast, ovarian, uterine, colon:   Pt denies family hx  Osteoporosis:   Discussed high calcium and vitamin D supplementation, weight bearing exercises Taking a vit D supplement, doing multivitamin  Skin cancer:  Hx of skin CA -  NO Discussed atypical lesions   Colorectal cancer:   colonoscopy is due - She wants to do, ok with referral today to [facility]   Lung cancer:   Low Dose CT Chest recommended if Age 54-80 years, 30 pack-year currently smoking OR have quit w/in 15years. Patient does not qualify.   Social History   Tobacco Use  . Smoking status: Passive Smoke Exposure - Never Smoker  . Smokeless tobacco: Never Used  Substance Use Topics  . Alcohol use: No     ECG:  Not indicated  Blood pressure/Hypertension: BP Readings from Last 3 Encounters:  05/17/19 132/84  04/17/18 (!) 144/96  12/11/17 (!) 150/82    Weight/Obesity: Wt Readings from Last 3 Encounters:  05/17/19 213 lb 9.6 oz (96.9 kg)  04/17/18 204 lb (92.5 kg)  12/11/17 205 lb (93 kg)   BMI Readings from Last 3 Encounters:  05/17/19 33.45 kg/m  04/17/18 31.95 kg/m  12/11/17 32.11 kg/m     Lipids:  Lab Results  Component Value Date   CHOL 116 05/13/2017   Lab Results  Component Value Date   HDL 69 05/13/2017   Lab Results  Component Value Date   LDLCALC 33 05/13/2017   Lab Results  Component Value Date   TRIG 60 05/13/2017   Lab Results  Component Value Date   CHOLHDL 1.7 05/13/2017   No results found for:  LDLDIRECT Based on the results of lipid panel his/her cardiovascular risk factor ( using [place] )  in the next 10 years is: The ASCVD Risk score Mikey Bussing DC Jr., et al., 2013) failed to calculate for the following reasons:   The valid total cholesterol range is 130 to 320 mg/dL  Glucose:  Glucose  Date Value Ref Range Status  07/15/2011 75 65 - 99 mg/dL Final   Glucose, Bld  Date Value Ref Range Status  11/27/2017 117 (H) 70 - 99 mg/dL Final  05/13/2017 83 65 - 99 mg/dL Final    Comment:    .            Fasting reference interval .   10/17/2016 130 (H) 65 - 99 mg/dL Final      [facility] Visit from 05/17/2019 in [facility]  AUDIT-C Score  0     Depression: Phq 9 is  negative Depression screen [facility] 2/9 05/17/2019 04/17/2018 05/13/2017  Decreased Interest 0 0 0  Down, Depressed, Hopeless 0 0 0  PHQ - 2 Score 0 0 0  Altered sleeping 0 - -  Tired, decreased energy 0 - -  Change in appetite 1 - -  Feeling bad or failure about yourself  0 - -  Trouble concentrating 0 - -  Moving slowly or fidgety/restless 0 - -  Suicidal thoughts 0 - -  PHQ-9 Score 1 - -  Difficult doing work/chores Not difficult at all - -   Hypertension: BP Readings from Last 3 Encounters:  05/17/19 132/84  04/17/18 (!) 144/96  12/11/17 (!) 150/82   Obesity: Wt Readings from Last 3 Encounters:  05/17/19 213 lb 9.6 oz (96.9 kg)  04/17/18 204 lb (92.5 kg)  12/11/17 205 lb (93 kg)   BMI Readings from Last 3 Encounters:  05/17/19 33.45 kg/m  04/17/18 31.95 kg/m  12/11/17 32.11 kg/m     Social History      She  reports that she is a non-smoker but has been exposed to tobacco smoke. She has never used smokeless tobacco. She reports that she does not drink alcohol or use drugs.       Social History   Socioeconomic History  . Marital status: Legally Separated    Spouse name: Not on file  . Number of children: Not on file  . Years of education: Not on file  . Highest education  level: Not on file  Occupational History  . Not on file  Tobacco Use  . Smoking status: Passive Smoke Exposure - Never Smoker  . Smokeless tobacco: Never Used  Substance and Sexual Activity  . Alcohol use: No  . Drug use: No  . Sexual activity: Yes  Partners: Male  Other Topics Concern  . Not on file  Social History Narrative   ** Merged History Encounter **       Social Determinants of Health   Financial Resource Strain:   . Difficulty of Paying Living Expenses: Not on file  Food Insecurity:   . Worried About Charity fundraiser in the Last Year: Not on file  . Ran Out of Food in the Last Year: Not on file  Transportation Needs:   . Lack of Transportation (Medical): Not on file  . Lack of Transportation (Non-Medical): Not on file  Physical Activity:   . Days of Exercise per Week: Not on file  . Minutes of Exercise per Session: Not on file  Stress:   . Feeling of Stress : Not on file  Social Connections:   . Frequency of Communication with Friends and Family: Not on file  . Frequency of Social Gatherings with Friends and Family: Not on file  . Attends Religious Services: Not on file  . Active Member of Clubs or Organizations: Not on file  . Attends Archivist Meetings: Not on file  . Marital Status: Not on file    Family History        Family Status  Relation Name Status  . Mother  Deceased  . Father  Deceased  . Brother  Alive  . MGM  (Not Specified)  . Mat Aunt  Deceased  . Cousin  (Not Specified)        Her family history includes Aneurysm in her father; Breast cancer in her cousin and maternal aunt; Cerebral aneurysm in her father; Hypertension in her maternal grandmother; Scleroderma in her mother.       Family History  Problem Relation Age of Onset  . Scleroderma Mother   . Aneurysm Father   . Cerebral aneurysm Father   . Hypertension Maternal Grandmother   . Breast cancer Maternal Aunt        [employer]  . Breast cancer Cousin      Patient Active Problem List   Diagnosis Date Noted  . Vitamin D deficiency 05/14/2017  . Shifting sleep-work schedule 05/13/2017  . Obesity 03/31/2015    History reviewed. No pertinent surgical history.   Current Outpatient Medications:  Marland Kitchen  Multiple Vitamin (MULTIVITAMIN) tablet, Take 1 tablet by mouth daily., Disp: , Rfl:  .  ondansetron (ZOFRAN) 4 MG tablet, Take 1 tablet (4 mg total) by mouth every 8 (eight) hours as needed for nausea or vomiting. (Patient not taking: Reported on 04/17/2018), Disp: 20 tablet, Rfl: 0  No Known Allergies  Patient Care Team: Hubbard Hartshorn, FNP as PCP - General (Family Medicine) Ashok Norris, MD (Family Medicine)  Review of Systems  Constitutional: Negative.  Negative for activity change, appetite change, fatigue and unexpected weight change.  HENT: Negative.   Eyes: Negative.   Respiratory: Negative.  Negative for shortness of breath.   Cardiovascular: Negative.  Negative for chest pain, palpitations and leg swelling.  Gastrointestinal: Negative.  Negative for abdominal pain and blood in stool.  Endocrine: Negative.   Genitourinary: Negative.   Musculoskeletal: Negative.  Negative for arthralgias, gait problem, joint swelling and myalgias.  Skin: Negative.  Negative for color change, pallor and rash.  Allergic/Immunologic: Negative.   Neurological: Negative.  Negative for syncope and weakness.  Hematological: Negative.   Psychiatric/Behavioral: Negative.  Negative for confusion, dysphoric mood, self-injury and suicidal ideas. The patient is not nervous/anxious.   All other systems  reviewed and are negative.    I personally reviewed active problem list, medication list, allergies, family history, social history, health maintenance, notes from last encounter, lab results, imaging with the patient/caregiver today. Reviewed last couple years of visits, labs, PAP results, mammogram       Objective:   Vitals:  Vitals:   05/17/19 0814   BP: 132/84  Pulse: 98  Resp: 16  Temp: 97.8 F (36.6 C)  SpO2: 98%  Weight: 213 lb 9.6 oz (96.9 kg)  Height: '5\' 7"'  (1.702 m)    Body mass index is 33.45 kg/m.  Physical Exam Vitals and nursing note reviewed. Exam conducted with a chaperone present.  Constitutional:      General: She is not in acute distress.    Appearance: Normal appearance. She is well-developed. She is obese. She is not ill-appearing, toxic-appearing or diaphoretic.     Interventions: Face mask in place.  HENT:     Head: Normocephalic and atraumatic.     Right Ear: External ear normal.     Left Ear: External ear normal.  Eyes:     General: Lids are normal. No scleral icterus.       Right eye: No discharge.        Left eye: No discharge.     Conjunctiva/sclera: Conjunctivae normal.  Neck:     Thyroid: No thyroid mass, thyromegaly or thyroid tenderness.     Trachea: Phonation normal. No tracheal deviation.  Cardiovascular:     Rate and Rhythm: Normal rate and regular rhythm.     Chest Wall: PMI is not displaced.     Pulses: Normal pulses.          Radial pulses are 2+ on the right side and 2+ on the left side.       Posterior tibial pulses are 2+ on the right side and 2+ on the left side.     Heart sounds: Normal heart sounds. No murmur. No friction rub. No gallop.      Comments: Bilateral non-pitting ankle and lower pretibial edema Pulmonary:     Effort: Pulmonary effort is normal. No respiratory distress.     Breath sounds: Normal breath sounds. No stridor. No wheezing, rhonchi or rales.  Chest:     Chest wall: No tenderness.     Breasts:        Right: Normal. No swelling, bleeding, inverted nipple, mass, nipple discharge, skin change or tenderness.        Left: Normal. No swelling, bleeding, inverted nipple, mass, nipple discharge, skin change or tenderness.  Abdominal:     General: Bowel sounds are normal. There is no distension.     Palpations: Abdomen is soft.     Tenderness: There is no  abdominal tenderness. There is no guarding or rebound.  Genitourinary:    Vagina: No erythema, tenderness or lesions.     Cervix: Erythema (?) and eversion present. No cervical motion tenderness, discharge or friability.     Uterus: Normal.      Adnexa: Right adnexa normal and left adnexa normal.       Right: No mass, tenderness or fullness.         Left: No mass, tenderness or fullness.       Comments: Weak vaginal walls that easily collapse through speculum obscuring cervical OS White clumpy vaginal discharge PAP obtained with broom and then brush from endocervical canal Musculoskeletal:        General: No deformity. Normal range of motion.  Cervical back: Normal range of motion and neck supple.     Right lower leg: Edema present.     Left lower leg: Edema present.  Lymphadenopathy:     Cervical: No cervical adenopathy.     Upper Body:     Right upper body: No supraclavicular, axillary or pectoral adenopathy.     Left upper body: No supraclavicular, axillary or pectoral adenopathy.  Skin:    General: Skin is warm and dry.     Capillary Refill: Capillary refill takes less than 2 seconds.     Coloration: Skin is not jaundiced or pale.     Findings: No rash.  Neurological:     Mental Status: She is alert and oriented to person, place, and time.     Motor: No abnormal muscle tone.     Gait: Gait normal.  Psychiatric:        Speech: Speech normal.        Behavior: Behavior normal.       Fall Risk: Fall Risk  05/17/2019 04/17/2018 05/13/2017  Falls in the past year? 0 - No  Number falls in past yr: - 0 -  Injury with Fall? - 0 -    Functional Status Survey: Is the patient deaf or have difficulty hearing?: No Does the patient have difficulty seeing, even when wearing glasses/contacts?: No Does the patient have difficulty concentrating, remembering, or making decisions?: No Does the patient have difficulty walking or climbing stairs?: No Does the patient have difficulty  dressing or bathing?: No Does the patient have difficulty doing errands alone such as visiting a doctor's office or shopping?: No   Assessment & Plan:    CPE completed today  . USPSTF grade A and B recommendations reviewed with patient; age-appropriate recommendations, preventive care, screening tests, etc discussed and encouraged; healthy living encouraged; see AVS for patient education given to patient  . Discussed importance of 150 minutes of physical activity weekly, AHA exercise recommendations given to pt in AVS/handout  . Discussed importance of healthy diet:  eating lean meats and proteins, avoiding trans fats and saturated fats, avoid simple sugars and excessive carbs in diet, eat 6 servings of fruit/vegetables daily and drink plenty of water and avoid sweet beverages.    . Recommended pt to do annual eye exam and routine dental exams/cleanings  . Depression, alcohol, fall screening completed as documented above and per flowsheets  . Reviewed Health Maintenance: Health Maintenance  Topic Date Due  . COLONOSCOPY  09/29/2010  . PAP SMEAR-Modifier  05/13/2018  . MAMMOGRAM  04/24/2019  . INFLUENZA VACCINE  07/14/2019 (Originally 11/14/2018)  . TETANUS/TDAP  05/16/2029  . Hepatitis C Screening  Completed  . HIV Screening  Completed   Pt agreed to tdap today, declined flu    ICD-10-CM   1. Adult general medical exam  S17.79 COMPLETE METABOLIC PANEL WITH GFR    Lipid panel    CBC with Differential/Platelet    TSH    Hemoglobin A1c    Cytology - PAP    MM 3D SCREEN BREAST BILATERAL  2. Screening for malignant neoplasm of cervix  Z12.4 Cytology - PAP  3. Encounter for screening mammogram for malignant neoplasm of breast  Z12.31 MM 3D SCREEN BREAST BILATERAL  4. Screening for endocrine, metabolic and immunity disorder  T90.30 COMPLETE METABOLIC PANEL WITH GFR   Z13.228 Lipid panel   Z13.0 CBC with Differential/Platelet    TSH    Hemoglobin A1c  5. Vitamin D deficiency  E55.9 VITAMIN D 25 Hydroxy (Vit-D Deficiency, Fractures)  6. Class 1 obesity with body mass index (BMI) of 33.0 to 33.9 in adult, unspecified obesity type, unspecified whether serious comorbidity present  V48.6 COMPLETE METABOLIC PANEL WITH GFR   Z68.33 Lipid panel    TSH    Hemoglobin A1c   some weight gain, discussed diet changes and increased activity, hand out given  7. Screening for deficiency anemia  Z13.0 CBC with Differential/Platelet  8. Screening for lipoid disorders  O82.417 COMPLETE METABOLIC PANEL WITH GFR    Lipid panel  9. Screening for malignant neoplasm of colon  Z12.11 Ambulatory referral to Gastroenterology  10. Need for Tdap vaccination  Z23 Tdap vaccine greater than or equal to 7yo IM   agreed to update tdap  11. Screening for tuberculosis  Z11.1 QuantiFERON-TB Gold Plus   added on by CMA at end of visit - screening for job       Delsa Grana, PA-C 05/17/19 9:30 AM  [place]

## 2019-05-18 ENCOUNTER — Other Ambulatory Visit: Payer: Self-pay

## 2019-05-18 LAB — CYTOLOGY - PAP
Comment: NEGATIVE
Diagnosis: NEGATIVE
High risk HPV: NEGATIVE

## 2019-05-18 MED ORDER — VITAMIN D (ERGOCALCIFEROL) 1.25 MG (50000 UNIT) PO CAPS: 50000.0000 [IU] | ORAL_CAPSULE | ORAL | 0 refills | Status: DC

## 2019-05-18 NOTE — Addendum Note (Signed)
Addended by: Danelle Berry on: 05/18/2019 04:26 PM   Modules accepted: Orders

## 2019-05-19 ENCOUNTER — Encounter: Payer: Self-pay | Admitting: Family Medicine

## 2019-05-19 LAB — CBC WITH DIFFERENTIAL/PLATELET
Absolute Monocytes: 613 cells/uL (ref 200–950)
Basophils Absolute: 22 cells/uL (ref 0–200)
Basophils Relative: 0.3 %
Eosinophils Absolute: 146 cells/uL (ref 15–500)
Eosinophils Relative: 2 %
HCT: 38 % (ref 35.0–45.0)
Hemoglobin: 12.5 g/dL (ref 11.7–15.5)
Lymphs Abs: 1978 cells/uL (ref 850–3900)
MCH: 29.8 pg (ref 27.0–33.0)
MCHC: 32.9 g/dL (ref 32.0–36.0)
MCV: 90.7 fL (ref 80.0–100.0)
MPV: 10.1 fL (ref 7.5–12.5)
Monocytes Relative: 8.4 %
Neutro Abs: 4541 cells/uL (ref 1500–7800)
Neutrophils Relative %: 62.2 %
Platelets: 231 10*3/uL (ref 140–400)
RBC: 4.19 10*6/uL (ref 3.80–5.10)
RDW: 12.4 % (ref 11.0–15.0)
Total Lymphocyte: 27.1 %
WBC: 7.3 10*3/uL (ref 3.8–10.8)

## 2019-05-19 LAB — COMPLETE METABOLIC PANEL WITH GFR
AG Ratio: 1.3 (calc) (ref 1.0–2.5)
ALT: 14 U/L (ref 6–29)
AST: 13 U/L (ref 10–35)
Albumin: 4.1 g/dL (ref 3.6–5.1)
Alkaline phosphatase (APISO): 68 U/L (ref 37–153)
BUN: 17 mg/dL (ref 7–25)
CO2: 29 mmol/L (ref 20–32)
Calcium: 9.7 mg/dL (ref 8.6–10.4)
Chloride: 106 mmol/L (ref 98–110)
Creat: 0.73 mg/dL (ref 0.50–1.05)
GFR, Est African American: 105 mL/min/{1.73_m2} (ref >=60)
GFR, Est Non African American: 91 mL/min/{1.73_m2} (ref >=60)
Globulin: 3.2 g/dL (calc) (ref 1.9–3.7)
Glucose, Bld: 85 mg/dL (ref 65–99)
Potassium: 4.3 mmol/L (ref 3.5–5.3)
Sodium: 141 mmol/L (ref 135–146)
Total Bilirubin: 0.4 mg/dL (ref 0.2–1.2)
Total Protein: 7.3 g/dL (ref 6.1–8.1)

## 2019-05-19 LAB — HEMOGLOBIN A1C
Hgb A1c MFr Bld: 5.1 % of total Hgb (ref <5.7)
Mean Plasma Glucose: 100 (calc)
eAG (mmol/L): 5.5 (calc)

## 2019-05-19 LAB — LIPID PANEL
Cholesterol: 107 mg/dL (ref <200)
HDL: 53 mg/dL (ref >=50)
LDL Cholesterol (Calc): 40 mg/dL (calc)
Non-HDL Cholesterol (Calc): 54 mg/dL (calc) (ref <130)
Total CHOL/HDL Ratio: 2 (calc) (ref <5.0)
Triglycerides: 56 mg/dL (ref <150)

## 2019-05-19 LAB — QUANTIFERON-TB GOLD PLUS
Mitogen-NIL: 9.73 IU/mL
NIL: 0.06 IU/mL
QuantiFERON-TB Gold Plus: NEGATIVE
TB1-NIL: 0.02 IU/mL
TB2-NIL: 0.02 IU/mL

## 2019-05-19 LAB — VITAMIN D 25 HYDROXY (VIT D DEFICIENCY, FRACTURES): Vit D, 25-Hydroxy: 16 ng/mL — ABNORMAL LOW (ref 30–100)

## 2019-05-19 LAB — TSH: TSH: 1.2 mIU/L (ref 0.40–4.50)

## 2019-05-20 ENCOUNTER — Other Ambulatory Visit: Payer: Self-pay

## 2019-05-20 ENCOUNTER — Telehealth: Payer: Self-pay

## 2019-05-20 DIAGNOSIS — Z1211 Encounter for screening for malignant neoplasm of colon: Secondary | ICD-10-CM

## 2019-05-20 NOTE — Telephone Encounter (Signed)
Gastroenterology Pre-Procedure Review  Request Date: Tuesday 06/29/19 Requesting Physician: Dr. Servando Snare  PATIENT REVIEW QUESTIONS: The patient responded to the following health history questions as indicated:    1. Are you having any GI issues? no 2. Do you have a personal history of Polyps? no 3. Do you have a family history of Colon Cancer or Polyps? no 4. Diabetes Mellitus? no 5. Joint replacements in the past 12 months?no 6. Major health problems in the past 3 months?no 7. Any artificial heart valves, MVP, or defibrillator?no    MEDICATIONS & ALLERGIES:    Patient reports the following regarding taking any anticoagulation/antiplatelet therapy:   Plavix, Coumadin, Eliquis, Xarelto, Lovenox, Pradaxa, Brilinta, or Effient? no Aspirin? no  Patient confirms/reports the following medications:  Current Outpatient Medications  Medication Sig Dispense Refill  . Multiple Vitamin (MULTIVITAMIN) tablet Take 1 tablet by mouth daily.    . ondansetron (ZOFRAN) 4 MG tablet Take 1 tablet (4 mg total) by mouth every 8 (eight) hours as needed for nausea or vomiting. (Patient not taking: Reported on 04/17/2018) 20 tablet 0  . Vitamin D, Ergocalciferol, (DRISDOL) 1.25 MG (50000 UNIT) CAPS capsule Take 1 capsule (50,000 Units total) by mouth every 7 (seven) days. x12 weeks. 12 capsule 0   No current facility-administered medications for this visit.    Patient confirms/reports the following allergies:  No Known Allergies  No orders of the defined types were placed in this encounter.   AUTHORIZATION INFORMATION Primary Insurance: 1D#: Group #:  Secondary Insurance: 1D#: Group #:  SCHEDULE INFORMATION: Date: Tuesday 06/29/19 Time: Location:[facility]

## 2019-06-15 ENCOUNTER — Telehealth: Payer: Self-pay

## 2019-06-15 DIAGNOSIS — Z1211 Encounter for screening for malignant neoplasm of colon: Secondary | ICD-10-CM

## 2019-06-15 DIAGNOSIS — Z130 Encounter for screening for diseases of the blood and blood-forming organs and certain disorders involving the immune mechanism: Secondary | ICD-10-CM

## 2019-06-15 NOTE — Telephone Encounter (Signed)
-----   Message from Danelle Berry, [place] sent at 06/15/2019  2:10 PM EST ----- Regarding: FW: Please put in referral to [facility] GI for CRC screening/colonoscopy  Thanks ----- Message ----- From: Rayann Heman, CMA Sent: 06/15/2019   1:42 PM EST To: Danelle Berry, PA-C  Hi Leisa,  You referred this pt to our office for a screening colonoscopy. Unfortunately, her insurance will only the procedure if she has it at an ambulatory clinic. I advised her the [facility] goes to [facility] which is a free standing clinic. She would like you to send a new referral to [facility] if you don't mind.   Thank you for your referral and we appreciate everything you do!  Ginger Feldpausch, CMA [facility]

## 2019-06-17 ENCOUNTER — Ambulatory Visit
Admission: RE | Admit: 2019-06-17 | Discharge: 2019-06-17 | Disposition: A | Discharge status: Home / Self Care | Payer: PRIVATE HEALTH INSURANCE | Source: Ambulatory Visit | Attending: Family Medicine | Admitting: Family Medicine

## 2019-06-17 DIAGNOSIS — Z1231 Encounter for screening mammogram for malignant neoplasm of breast: Secondary | ICD-10-CM | POA: Diagnosis not present

## 2019-06-17 DIAGNOSIS — Z0000 Encounter for general adult medical examination without abnormal findings: Secondary | ICD-10-CM

## 2019-06-29 ENCOUNTER — Ambulatory Visit: Admit: 2019-06-29 | Payer: No Typology Code available for payment source | Admitting: Gastroenterology

## 2019-06-29 SURGERY — COLONOSCOPY WITH PROPOFOL
Anesthesia: General

## 2019-11-15 ENCOUNTER — Ambulatory Visit: Payer: No Typology Code available for payment source | Admitting: Family Medicine

## 2020-03-20 ENCOUNTER — Ambulatory Visit: Payer: No Typology Code available for payment source | Admitting: Dermatology

## 2020-05-18 ENCOUNTER — Encounter: Payer: No Typology Code available for payment source | Admitting: Family Medicine

## 2020-06-29 ENCOUNTER — Other Ambulatory Visit (HOSPITAL_COMMUNITY)
Admission: RE | Admit: 2020-06-29 | Discharge: 2020-06-29 | Disposition: A | Discharge status: Home / Self Care | Payer: No Typology Code available for payment source | Source: Ambulatory Visit | Attending: Family Medicine | Admitting: Family Medicine

## 2020-06-29 ENCOUNTER — Other Ambulatory Visit: Payer: Self-pay

## 2020-06-29 ENCOUNTER — Ambulatory Visit (INDEPENDENT_AMBULATORY_CARE_PROVIDER_SITE_OTHER): Payer: No Typology Code available for payment source | Admitting: Family Medicine

## 2020-06-29 ENCOUNTER — Encounter: Payer: Self-pay | Admitting: Family Medicine

## 2020-06-29 VITALS — BP 124/72 | HR 83 | Temp 98.2°F | Resp 16 | Ht 67.0 in | Wt 206.3 lb

## 2020-06-29 DIAGNOSIS — Z113 Encounter for screening for infections with a predominantly sexual mode of transmission: Secondary | ICD-10-CM | POA: Diagnosis present

## 2020-06-29 DIAGNOSIS — Z124 Encounter for screening for malignant neoplasm of cervix: Secondary | ICD-10-CM

## 2020-06-29 DIAGNOSIS — Z1231 Encounter for screening mammogram for malignant neoplasm of breast: Secondary | ICD-10-CM | POA: Diagnosis not present

## 2020-06-29 DIAGNOSIS — Z1211 Encounter for screening for malignant neoplasm of colon: Secondary | ICD-10-CM

## 2020-06-29 DIAGNOSIS — Z0000 Encounter for general adult medical examination without abnormal findings: Secondary | ICD-10-CM

## 2020-06-29 DIAGNOSIS — E559 Vitamin D deficiency, unspecified: Secondary | ICD-10-CM | POA: Diagnosis not present

## 2020-06-29 NOTE — Patient Instructions (Signed)
Health Maintenance  Topic Date Due  . COVID-19 Vaccine (1) Never done  . Colon Cancer Screening  Never done  . Mammogram  06/16/2020  . Pap Smear  05/16/2020  . Flu Shot  08/15/2020*  . Tetanus Vaccine  05/16/2029  .  Hepatitis C: One time screening is recommended by Center for Disease Control  (CDC) for  adults born from [birth year] through 1965.   Completed  . HIV Screening  Completed  . HPV Vaccine  Aged Out  *Topic was postponed. The date shown is not the original due date.     Preventive Care 18-8 Years Old, Female Preventive care refers to lifestyle choices and visits with your health care provider that can promote health and wellness. This includes:  A yearly physical exam. This is also called an annual wellness visit.  Regular dental and eye exams.  Immunizations.  Screening for certain conditions.  Healthy lifestyle choices, such as: ? Eating a healthy diet. ? Getting regular exercise. ? Not using drugs or products that contain nicotine and tobacco. ? Limiting alcohol use. What can I expect for my preventive care visit? Physical exam Your health care provider will check your:  Height and weight. These may be used to calculate your BMI (body mass index). BMI is a measurement that tells if you are at a healthy weight.  Heart rate and blood pressure.  Body temperature.  Skin for abnormal spots. Counseling Your health care provider may ask you questions about your:  Past medical problems.  Family's medical history.  Alcohol, tobacco, and drug use.  Emotional well-being.  Home life and relationship well-being.  Sexual activity.  Diet, exercise, and sleep habits.  Work and work Statistician.  Access to firearms.  Method of birth control.  Menstrual cycle.  Pregnancy history. What immunizations do I need? Vaccines are usually given at various ages, according to a schedule. Your health care provider will recommend vaccines for you based on your age,  medical history, and lifestyle or other factors, such as travel or where you work.   What tests do I need? Blood tests  Lipid and cholesterol levels. These may be checked every 5 years, or more often if you are over 21 years old.  Hepatitis C test.  Hepatitis B test. Screening  Lung cancer screening. You may have this screening every year starting at age 72 if you have a 30-pack-year history of smoking and currently smoke or have quit within the past 15 years.  Colorectal cancer screening. ? All adults should have this screening starting at age 37 and continuing until age 20. ? Your health care provider may recommend screening at age 69 if you are at increased risk. ? You will have tests every 1-10 years, depending on your results and the type of screening test.  Diabetes screening. ? This is done by checking your blood sugar (glucose) after you have not eaten for a while (fasting). ? You may have this done every 1-3 years.  Mammogram. ? This may be done every 1-2 years. ? Talk with your health care provider about when you should start having regular mammograms. This may depend on whether you have a family history of breast cancer.  BRCA-related cancer screening. This may be done if you have a family history of breast, ovarian, tubal, or peritoneal cancers.  Pelvic exam and Pap test. ? This may be done every 3 years starting at age 28. ? Starting at age 59, this may be done every 5  years if you have a Pap test in combination with an HPV test. Other tests  STD (sexually transmitted disease) testing, if you are at risk.  Bone density scan. This is done to screen for osteoporosis. You may have this scan if you are at high risk for osteoporosis. Talk with your health care provider about your test results, treatment options, and if necessary, the need for more tests. Follow these instructions at home: Eating and drinking  Eat a diet that includes fresh fruits and vegetables, whole  grains, lean protein, and low-fat dairy products.  Take vitamin and mineral supplements as recommended by your health care provider.  Do not drink alcohol if: ? Your health care provider tells you not to drink. ? You are pregnant, may be pregnant, or are planning to become pregnant.  If you drink alcohol: ? Limit how much you have to 0-1 drink a day. ? Be aware of how much alcohol is in your drink. In the [place] one drink equals one 12 oz bottle of beer (355 mL), one 5 oz glass of wine (148 mL), or one 1 oz glass of hard liquor (44 mL).   Lifestyle  Take daily care of your teeth and gums. Brush your teeth every morning and night with fluoride toothpaste. Floss one time each day.  Stay active. Exercise for at least 30 minutes 5 or more days each week.  Do not use any products that contain nicotine or tobacco, such as cigarettes, e-cigarettes, and chewing tobacco. If you need help quitting, ask your health care provider.  Do not use drugs.  If you are sexually active, practice safe sex. Use a condom or other form of protection to prevent STIs (sexually transmitted infections).  If you do not wish to become pregnant, use a form of birth control. If you plan to become pregnant, see your health care provider for a prepregnancy visit.  If told by your health care provider, take low-dose aspirin daily starting at age 39.  Find healthy ways to cope with stress, such as: ? Meditation, yoga, or listening to music. ? Journaling. ? Talking to a trusted person. ? Spending time with friends and family. Safety  Always wear your seat belt while driving or riding in a vehicle.  Do not drive: ? If you have been drinking alcohol. Do not ride with someone who has been drinking. ? When you are tired or distracted. ? While texting.  Wear a helmet and other protective equipment during sports activities.  If you have firearms in your house, make sure you follow all gun safety procedures. What's  next?  Visit your health care provider once a year for an annual wellness visit.  Ask your health care provider how often you should have your eyes and teeth checked.  Stay up to date on all vaccines. This information is not intended to replace advice given to you by your health care provider. Make sure you discuss any questions you have with your health care provider. Document Revised: 01/04/2020 Document Reviewed: 12/11/2017 Elsevier Patient Education  2021 [employer].

## 2020-06-29 NOTE — Progress Notes (Signed)
Patient: Gloria Fuller, Female    DOB: 06/18/1960, 60 y.o.   MRN: [id number] Delsa Grana, PA-C Visit Date: 06/29/2020  Today's Provider: Delsa Grana, PA-C   Chief Complaint  Patient presents with  . Annual Exam   Subjective:   Annual physical exam:  MASSIEL Fuller is a 60 y.o. female who presents today for complete physical exam:  Exercise/Activity:3 d a week/ 40 + min each Diet/nutrition:  Starting diet, wants to loose a few lbs Sleep: no concerns  Vit D deficiency -  Last vitamin D Lab Results  Component Value Date   VD25OH 16 (L) 05/17/2019   USPSTF grade A and B recommendations - reviewed and addressed today  Depression:  Phq 9 completed today by patient, was reviewed by me with patient in the room PHQ score is neg, pt feels good PHQ 2/9 Scores 06/29/2020 05/17/2019 04/17/2018 05/13/2017  PHQ - 2 Score 0 0 0 0  PHQ- 9 Score 0 1 - -   Depression screen [facility] 2/9 06/29/2020 05/17/2019 04/17/2018 05/13/2017  Decreased Interest 0 0 0 0  Down, Depressed, Hopeless 0 0 0 0  PHQ - 2 Score 0 0 0 0  Altered sleeping 0 0 - -  Tired, decreased energy 0 0 - -  Change in appetite 0 1 - -  Feeling bad or failure about yourself  0 0 - -  Trouble concentrating 0 0 - -  Moving slowly or fidgety/restless 0 0 - -  Suicidal thoughts 0 0 - -  PHQ-9 Score 0 1 - -  Difficult doing work/chores - Not difficult at all - -    Alcohol screening: [place] Office Visit from 05/17/2019 in [facility]  AUDIT-C Score 0      Immunizations and Health Maintenance: Health Maintenance  Topic Date Due  . COVID-19 Vaccine (1) Never done  . COLONOSCOPY (Pts 45-52yr Insurance coverage will need to be confirmed)  Never done  . MAMMOGRAM  06/16/2020  . PAP SMEAR-Modifier  05/16/2020  . INFLUENZA VACCINE  08/15/2020 (Originally 11/14/2019)  . TETANUS/TDAP  05/16/2029  . Hepatitis C Screening  Completed  . HIV Screening  Completed  . HPV VACCINES  Aged Out     Hep C Screening:  done  STD testing and prevention (HIV/chl/gon/syphilis):  see above, no additional testing desired by pt today  Intimate partner violence:  denies  Sexual History/Pain during Intercourse:    Menstrual History/LMP/Abnormal Bleeding: denies AUB Patient's last menstrual period was 09/03/2015.  Incontinence Symptoms: none  Breast cancer: due - ordered Last Mammogram: *see HM list above BRCA gene screening: none known  Cervical cancer screening: done last year neg HPV, PAP neg Pt denies family hx of cancers - breast, ovarian, uterine, colon:     Osteoporosis:   Discussion on osteoporosis per age, including high calcium and vitamin D supplementation, weight bearing exercises Pt is supplementing with daily calcium/Vit D. Roughly experienced menopause at age 60 - 5 years ago  Skin cancer:  Hx of skin CA -  NO Discussed atypical lesions   Colorectal cancer:   Colonoscopy is due   Discussed concerning signs and sx of CRC, pt denies melena, hematochezia  Lung cancer:   Low Dose CT Chest recommended if Age 60-80years, 30 pack-year currently smoking OR have quit w/in 15years. Patient does not qualify.    Social History   Tobacco Use  . Smoking status: Passive Smoke Exposure - Never Smoker  . Smokeless tobacco:  Never Used  Vaping Use  . Vaping Use: Never used  Substance Use Topics  . Alcohol use: No  . Drug use: No     Flowsheet Row Office Visit from 05/17/2019 in [facility]  AUDIT-C Score 0      Family History  Problem Relation Age of Onset  . Scleroderma Mother   . Aneurysm Father   . Cerebral aneurysm Father   . Hypertension Maternal Grandmother   . Breast cancer Maternal Aunt        [employer]  . Breast cancer Cousin        maternal     Blood pressure/Hypertension: BP Readings from Last 3 Encounters:  06/29/20 124/72  05/17/19 132/84  04/17/18 (!) 144/96    Weight/Obesity: Wt Readings from Last 3 Encounters:  06/29/20 206 lb 4.8 oz  (93.6 kg)  05/17/19 213 lb 9.6 oz (96.9 kg)  04/17/18 204 lb (92.5 kg)   BMI Readings from Last 3 Encounters:  06/29/20 32.31 kg/m  05/17/19 33.45 kg/m  04/17/18 31.95 kg/m     Lipids:  Lab Results  Component Value Date   CHOL 107 05/17/2019   CHOL 116 05/13/2017   Lab Results  Component Value Date   HDL 53 05/17/2019   HDL 69 05/13/2017   Lab Results  Component Value Date   LDLCALC 40 05/17/2019   LDLCALC 33 05/13/2017   Lab Results  Component Value Date   TRIG 56 05/17/2019   TRIG 60 05/13/2017   Lab Results  Component Value Date   CHOLHDL 2.0 05/17/2019   CHOLHDL 1.7 05/13/2017   No results found for: LDLDIRECT Based on the results of lipid panel his/her cardiovascular risk factor ( using [place] )  in the next 10 years is: The ASCVD Risk score Mikey Bussing DC Jr., et al., 2013) failed to calculate for the following reasons:   The valid total cholesterol range is 130 to 320 mg/dL Glucose:  Glucose  Date Value Ref Range Status  07/15/2011 75 65 - 99 mg/dL Final   Glucose, Bld  Date Value Ref Range Status  05/17/2019 85 65 - 99 mg/dL Final    Comment:    .            Fasting reference interval .   11/27/2017 117 (H) 70 - 99 mg/dL Final  05/13/2017 83 65 - 99 mg/dL Final    Comment:    .            Fasting reference interval .    Hypertension: BP Readings from Last 3 Encounters:  06/29/20 124/72  05/17/19 132/84  04/17/18 (!) 144/96   Obesity: Wt Readings from Last 3 Encounters:  06/29/20 206 lb 4.8 oz (93.6 kg)  05/17/19 213 lb 9.6 oz (96.9 kg)  04/17/18 204 lb (92.5 kg)   BMI Readings from Last 3 Encounters:  06/29/20 32.31 kg/m  05/17/19 33.45 kg/m  04/17/18 31.95 kg/m      Advanced Care Planning:  A voluntary discussion about advance care planning including the explanation and discussion of advance directives.   Discussed health care proxy and Living will, and the patient was able to identify a health care proxy as Jiana Lemaire  (son).  Patient does not have a living will at present time.   Social History      She        Social History   Socioeconomic History  . Marital status: Legally Separated    Spouse name: Not  on file  . Number of children: Not on file  . Years of education: Not on file  . Highest education level: Not on file  Occupational History  . Not on file  Tobacco Use  . Smoking status: Passive Smoke Exposure - Never Smoker  . Smokeless tobacco: Never Used  Vaping Use  . Vaping Use: Never used  Substance and Sexual Activity  . Alcohol use: No  . Drug use: No  . Sexual activity: Yes    Partners: Male  Other Topics Concern  . Not on file  Social History Narrative   ** Merged History Encounter **       Social Determinants of Health   Financial Resource Strain: Low Risk   . Difficulty of Paying Living Expenses: Not hard at all  Food Insecurity: No Food Insecurity  . Worried About Charity fundraiser in the Last Year: Never true  . Ran Out of Food in the Last Year: Never true  Transportation Needs: No Transportation Needs  . Lack of Transportation (Medical): No  . Lack of Transportation (Non-Medical): No  Physical Activity: Insufficiently Active  . Days of Exercise per Week: 3 days  . Minutes of Exercise per Session: 40 min  Stress: No Stress Concern Present  . Feeling of Stress : Not at all  Social Connections: Socially Isolated  . Frequency of Communication with Friends and Family: Never  . Frequency of Social Gatherings with Friends and Family: Never  . Attends Religious Services: Never  . Active Member of Clubs or Organizations: No  . Attends Archivist Meetings: Never  . Marital Status: Married    Family History        Family History  Problem Relation Age of Onset  . Scleroderma Mother   . Aneurysm Father   . Cerebral aneurysm Father   . Hypertension Maternal Grandmother   . Breast cancer Maternal Aunt        [employer]  . Breast cancer Cousin         maternal    Patient Active Problem List   Diagnosis Date Noted  . Vitamin D deficiency 05/14/2017  . Shifting sleep-work schedule 05/13/2017  . Obesity 03/31/2015    History reviewed. No pertinent surgical history.   Current Outpatient Medications:  Marland Kitchen  Multiple Vitamin (MULTIVITAMIN) tablet, Take 1 tablet by mouth daily., Disp: , Rfl:   No Known Allergies  Patient Care Team: Delsa Grana, PA-C as PCP - General (Family Medicine) Ashok Norris, MD (Family Medicine)  Review of Systems  Constitutional: Negative.   HENT: Negative.   Eyes: Negative.   Respiratory: Negative.   Cardiovascular: Negative.   Gastrointestinal: Negative.   Endocrine: Negative.   Genitourinary: Negative.   Musculoskeletal: Negative.   Skin: Negative.   Allergic/Immunologic: Negative.   Neurological: Negative.   Hematological: Negative.   Psychiatric/Behavioral: Negative.   All other systems reviewed and are negative.      .I personally reviewed active problem list, medication list, allergies, family history, social history, health maintenance, notes from last encounter, lab results, imaging with the patient/caregiver today.        Objective:   Vitals:  Vitals:   06/29/20 1426  BP: 124/72  Pulse: 83  Resp: 16  Temp: 98.2 F (36.8 C)  SpO2: 99%  Weight: 206 lb 4.8 oz (93.6 kg)  Height: '5\' 7"'  (1.702 m)    Body mass index is 32.31 kg/m.  Physical Exam Vitals and nursing note reviewed. Exam conducted with  a chaperone present.  Constitutional:      General: She is not in acute distress.    Appearance: Normal appearance. She is well-developed. She is not toxic-appearing or diaphoretic.  HENT:     Head: Normocephalic and atraumatic.     Right Ear: External ear normal.     Left Ear: External ear normal.     Nose: Nose normal.     Mouth/Throat:     Pharynx: Uvula midline.  Eyes:     General: Lids are normal.     Conjunctiva/sclera: Conjunctivae normal.     Pupils: Pupils are  equal, round, and reactive to light.  Neck:     Trachea: Phonation normal. No tracheal deviation.  Cardiovascular:     Rate and Rhythm: Normal rate and regular rhythm.     Pulses: Normal pulses.          Radial pulses are 2+ on the right side and 2+ on the left side.       Posterior tibial pulses are 2+ on the right side and 2+ on the left side.     Heart sounds: Normal heart sounds. No murmur heard. No friction rub. No gallop.   Pulmonary:     Effort: Pulmonary effort is normal. No respiratory distress.     Breath sounds: Normal breath sounds. No stridor. No wheezing, rhonchi or rales.  Chest:     Chest wall: No tenderness.  Breasts:     Right: Normal. No axillary adenopathy or supraclavicular adenopathy.     Left: Normal. No axillary adenopathy or supraclavicular adenopathy.    Abdominal:     General: Bowel sounds are normal. There is no distension.     Palpations: Abdomen is soft.     Tenderness: There is no abdominal tenderness. There is no guarding or rebound.  Genitourinary:    Comments: PAP done No CMT, no masses or pain Musculoskeletal:        General: No deformity. Normal range of motion.     Cervical back: Normal range of motion and neck supple.  Lymphadenopathy:     Cervical: No cervical adenopathy.     Upper Body:     Right upper body: No supraclavicular, axillary or pectoral adenopathy.     Left upper body: No supraclavicular, axillary or pectoral adenopathy.  Skin:    General: Skin is warm and dry.     Capillary Refill: Capillary refill takes less than 2 seconds.     Coloration: Skin is not pale.     Findings: No rash.  Neurological:     Mental Status: She is alert and oriented to person, place, and time.     Motor: No abnormal muscle tone.     Gait: Gait normal.  Psychiatric:        Speech: Speech normal.        Behavior: Behavior normal.       Fall Risk: Fall Risk  06/29/2020 05/17/2019 04/17/2018 05/13/2017  Falls in the past year? 0 0 - No  Number  falls in past yr: 0 - 0 -  Injury with Fall? 0 - 0 -    Functional Status Survey: Is the patient deaf or have difficulty hearing?: No Does the patient have difficulty seeing, even when wearing glasses/contacts?: No Does the patient have difficulty concentrating, remembering, or making decisions?: No Does the patient have difficulty walking or climbing stairs?: No Does the patient have difficulty dressing or bathing?: No Does the patient have difficulty doing errands alone  such as visiting a doctor's office or shopping?: No   Assessment & Plan:    CPE completed today  . USPSTF grade A and B recommendations reviewed with patient; age-appropriate recommendations, preventive care, screening tests, etc discussed and encouraged; healthy living encouraged; see AVS for patient education given to patient  . Discussed importance of 150 minutes of physical activity weekly, AHA exercise recommendations given to pt in AVS/handout  . Discussed importance of healthy diet:  eating lean meats and proteins, avoiding trans fats and saturated fats, avoid simple sugars and excessive carbs in diet, eat 6 servings of fruit/vegetables daily and drink plenty of water and avoid sweet beverages.    . Recommended pt to do annual eye exam and routine dental exams/cleanings  . Depression, alcohol, fall screening completed as documented above and per flowsheets  . Reviewed Health Maintenance: Health Maintenance  Topic Date Due  . COVID-19 Vaccine (1) Never done  . COLONOSCOPY (Pts 45-32yr Insurance coverage will need to be confirmed)  Never done  . MAMMOGRAM  06/16/2020  . PAP SMEAR-Modifier  05/16/2020  . INFLUENZA VACCINE  08/15/2020 (Originally 11/14/2019)  . TETANUS/TDAP  05/16/2029  . Hepatitis C Screening  Completed  . HIV Screening  Completed  . HPV VACCINES  Aged Out    . Immunizations: Immunization History  Administered Date(s) Administered  . Tdap 05/17/2019      ICD-10-CM   1. Adult  general medical exam  Z00.00 Lipid panel    COMPLETE METABOLIC PANEL WITH GFR    CBC w/Diff/Platelet  2. Screening for colon cancer  Z12.11 Ambulatory referral to Gastroenterology  3. Encounter for screening mammogram for malignant neoplasm of breast  Z12.31 MM 3D SCREEN BREAST BILATERAL  4. Vitamin D deficiency  EU44.0COMPLETE METABOLIC PANEL WITH GFR    VITAMIN D 25 Hydroxy (Vit-D Deficiency, Fractures)  5. Screening for STD (sexually transmitted disease)  Z11.3 Cytology - PAP  6. Screening for malignant neoplasm of cervix  Z12.4 Cytology - PAP       LDelsa Grana PA-C 06/29/20 3:01 PM  [facility]

## 2020-06-30 LAB — COMPLETE METABOLIC PANEL WITH GFR
AG Ratio: 1.4 (calc) (ref 1.0–2.5)
ALT: 16 U/L (ref 6–29)
AST: 16 U/L (ref 10–35)
Albumin: 4.2 g/dL (ref 3.6–5.1)
Alkaline phosphatase (APISO): 73 U/L (ref 37–153)
BUN: 14 mg/dL (ref 7–25)
CO2: 31 mmol/L (ref 20–32)
Calcium: 10 mg/dL (ref 8.6–10.4)
Chloride: 103 mmol/L (ref 98–110)
Creat: 0.71 mg/dL (ref 0.50–1.05)
GFR, Est African American: 108 mL/min/{1.73_m2} (ref >=60)
GFR, Est Non African American: 93 mL/min/{1.73_m2} (ref >=60)
Globulin: 3.1 g/dL (calc) (ref 1.9–3.7)
Glucose, Bld: 80 mg/dL (ref 65–99)
Potassium: 4.1 mmol/L (ref 3.5–5.3)
Sodium: 139 mmol/L (ref 135–146)
Total Bilirubin: 0.4 mg/dL (ref 0.2–1.2)
Total Protein: 7.3 g/dL (ref 6.1–8.1)

## 2020-06-30 LAB — CBC WITH DIFFERENTIAL/PLATELET
Absolute Monocytes: 619 cells/uL (ref 200–950)
Basophils Absolute: 41 cells/uL (ref 0–200)
Basophils Relative: 0.6 %
Eosinophils Absolute: 136 cells/uL (ref 15–500)
Eosinophils Relative: 2 %
HCT: 40.1 % (ref 35.0–45.0)
Hemoglobin: 13 g/dL (ref 11.7–15.5)
Lymphs Abs: 2482 cells/uL (ref 850–3900)
MCH: 29.6 pg (ref 27.0–33.0)
MCHC: 32.4 g/dL (ref 32.0–36.0)
MCV: 91.3 fL (ref 80.0–100.0)
MPV: 10.3 fL (ref 7.5–12.5)
Monocytes Relative: 9.1 %
Neutro Abs: 3522 cells/uL (ref 1500–7800)
Neutrophils Relative %: 51.8 %
Platelets: 264 10*3/uL (ref 140–400)
RBC: 4.39 10*6/uL (ref 3.80–5.10)
RDW: 12 % (ref 11.0–15.0)
Total Lymphocyte: 36.5 %
WBC: 6.8 10*3/uL (ref 3.8–10.8)

## 2020-06-30 LAB — LIPID PANEL
Cholesterol: 109 mg/dL (ref <200)
HDL: 57 mg/dL (ref >=50)
LDL Cholesterol (Calc): 38 mg/dL (calc)
Non-HDL Cholesterol (Calc): 52 mg/dL (calc) (ref <130)
Total CHOL/HDL Ratio: 1.9 (calc) (ref <5.0)
Triglycerides: 65 mg/dL (ref <150)

## 2020-06-30 LAB — VITAMIN D 25 HYDROXY (VIT D DEFICIENCY, FRACTURES): Vit D, 25-Hydroxy: 36 ng/mL (ref 30–100)

## 2020-07-03 LAB — CYTOLOGY - PAP
Chlamydia: NEGATIVE
Comment: NEGATIVE
Comment: NEGATIVE
Comment: NEGATIVE
Comment: NORMAL
Diagnosis: NEGATIVE
High risk HPV: POSITIVE — AB
Neisseria Gonorrhea: NEGATIVE
Trichomonas: NEGATIVE

## 2020-07-13 ENCOUNTER — Encounter: Payer: Self-pay | Admitting: Family Medicine

## 2020-07-19 ENCOUNTER — Telehealth (INDEPENDENT_AMBULATORY_CARE_PROVIDER_SITE_OTHER): Payer: Self-pay | Admitting: Gastroenterology

## 2020-07-19 ENCOUNTER — Other Ambulatory Visit: Payer: Self-pay

## 2020-07-19 DIAGNOSIS — Z1211 Encounter for screening for malignant neoplasm of colon: Secondary | ICD-10-CM

## 2020-07-19 MED ORDER — NA SULFATE-K SULFATE-MG SULF 17.5-3.13-1.6 GM/177ML PO SOLN: 1.0000 | Freq: Once | ORAL | 0 refills | Status: AC

## 2020-07-19 NOTE — Progress Notes (Signed)
Gastroenterology Pre-Procedure Review  Request Date: 08/02/20 Requesting Physician: Dr. Vicente Males  PATIENT REVIEW QUESTIONS: The patient responded to the following health history questions as indicated:    1. Are you having any GI issues? no 2. Do you have a personal history of Polyps? no 3. Do you have a family history of Colon Cancer or Polyps? no 4. Diabetes Mellitus? no 5. Joint replacements in the past 12 months?no 6. Major health problems in the past 3 months?no 7. Any artificial heart valves, MVP, or defibrillator?no    MEDICATIONS & ALLERGIES:    Patient reports the following regarding taking any anticoagulation/antiplatelet therapy:   Plavix, Coumadin, Eliquis, Xarelto, Lovenox, Pradaxa, Brilinta, or Effient? No Aspirin? no  Patient confirms/reports the following medications:  Current Outpatient Medications  Medication Sig Dispense Refill  . Multiple Vitamin (MULTIVITAMIN) tablet Take 1 tablet by mouth daily.    . Na Sulfate-K Sulfate-Mg Sulf 17.5-3.13-1.6 GM/177ML SOLN Take 1 kit by mouth once for 1 dose. 354 mL 0   No current facility-administered medications for this visit.    Patient confirms/reports the following allergies:  No Known Allergies  No orders of the defined types were placed in this encounter.   AUTHORIZATION INFORMATION Primary Insurance: 1D#: Group #:  Secondary Insurance: 1D#: Group #:  SCHEDULE INFORMATION: Date: 08/02/20 Time: Location: [place]

## 2020-07-24 ENCOUNTER — Ambulatory Visit: Payer: No Typology Code available for payment source | Admitting: Podiatry

## 2020-07-31 ENCOUNTER — Telehealth: Payer: Self-pay

## 2020-07-31 NOTE — Telephone Encounter (Signed)
Patient left [place] a vmail asking [place] to cancel her upcoming procedure on Wednesday, April/ 20/22. Thanks

## 2020-08-01 NOTE — Telephone Encounter (Signed)
Returned patients phone call to reschedule colonoscopy.  LVM for her to call office back to reschedule.   Procedure scheduled for 08/02/20 has been canceled.  Thanks,  [place], [place]

## 2020-08-02 ENCOUNTER — Ambulatory Visit
Admission: RE | Admit: 2020-08-02 | Payer: No Typology Code available for payment source | Source: Ambulatory Visit | Admitting: Gastroenterology

## 2020-08-02 ENCOUNTER — Encounter: Admission: RE | Payer: Self-pay | Source: Ambulatory Visit

## 2020-08-02 SURGERY — COLONOSCOPY WITH PROPOFOL
Anesthesia: General

## 2020-09-06 ENCOUNTER — Ambulatory Visit: Payer: No Typology Code available for payment source

## 2020-09-06 ENCOUNTER — Encounter: Payer: No Typology Code available for payment source | Admitting: Podiatry

## 2020-09-06 DIAGNOSIS — M2041 Other hammer toe(s) (acquired), right foot: Secondary | ICD-10-CM

## 2020-09-06 DIAGNOSIS — M2042 Other hammer toe(s) (acquired), left foot: Secondary | ICD-10-CM

## 2020-09-06 NOTE — Progress Notes (Signed)
This encounter was created in error - please disregard.

## 2020-09-18 ENCOUNTER — Ambulatory Visit: Payer: No Typology Code available for payment source | Admitting: Podiatry

## 2020-10-24 ENCOUNTER — Ambulatory Visit: Payer: No Typology Code available for payment source | Admitting: Podiatry

## 2020-10-30 ENCOUNTER — Ambulatory Visit: Payer: No Typology Code available for payment source | Admitting: Dermatology

## 2020-11-07 ENCOUNTER — Ambulatory Visit: Payer: No Typology Code available for payment source | Admitting: Podiatry

## 2020-12-25 ENCOUNTER — Ambulatory Visit
Admission: RE | Admit: 2020-12-25 | Discharge: 2020-12-25 | Disposition: A | Discharge status: Home / Self Care | Payer: No Typology Code available for payment source | Source: Ambulatory Visit | Attending: Family Medicine | Admitting: Family Medicine

## 2020-12-25 ENCOUNTER — Other Ambulatory Visit: Payer: Self-pay

## 2020-12-25 DIAGNOSIS — Z1231 Encounter for screening mammogram for malignant neoplasm of breast: Secondary | ICD-10-CM | POA: Insufficient documentation

## 2020-12-28 ENCOUNTER — Ambulatory Visit: Payer: No Typology Code available for payment source | Admitting: Podiatry

## 2021-01-03 ENCOUNTER — Ambulatory Visit: Payer: No Typology Code available for payment source | Admitting: Podiatry

## 2021-06-25 ENCOUNTER — Ambulatory Visit (INDEPENDENT_AMBULATORY_CARE_PROVIDER_SITE_OTHER): Payer: No Typology Code available for payment source | Admitting: Family Medicine

## 2021-06-25 ENCOUNTER — Encounter: Payer: Self-pay | Admitting: Family Medicine

## 2021-06-25 VITALS — BP 110/72 | HR 96 | Temp 98.6°F | Resp 16 | Ht 66.5 in | Wt 202.5 lb

## 2021-06-25 DIAGNOSIS — N3001 Acute cystitis with hematuria: Secondary | ICD-10-CM

## 2021-06-25 DIAGNOSIS — R5383 Other fatigue: Secondary | ICD-10-CM

## 2021-06-25 LAB — POCT URINALYSIS DIPSTICK
Bilirubin, UA: NEGATIVE
Glucose, UA: NEGATIVE
Ketones, UA: NEGATIVE
Nitrite, UA: POSITIVE
Protein, UA: POSITIVE — AB
Spec Grav, UA: 1.01 (ref 1.010–1.025)
Urobilinogen, UA: 0.2 E.U./dL
pH, UA: 5 (ref 5.0–8.0)

## 2021-06-25 MED ORDER — CEPHALEXIN 500 MG PO CAPS: 500.0000 mg | ORAL_CAPSULE | Freq: Three times a day (TID) | ORAL | 0 refills | Status: DC

## 2021-06-25 NOTE — Progress Notes (Signed)
? ?Name: Gloria Fuller   MRN: [id number]    DOB: Dec 13, 1960   Date:06/25/2021 ? ?     Progress Note ? ?Chief Complaint  ?Patient presents with  ? Urinary Frequency  ?  Pt states has been experiencing this since 06/22/21. No pain/blood while urinating.  ? Fatigue  ? ? ? ?Subjective:  ? ?Gloria Fuller is a 61 y.o. female, presents to clinic for urinary frequency and fatigue x 3 d ? ?Urinary freq w/o hematuria, urgency, back pain, abd pain, N, V, fever ?Denies any vaginal sx ?She does endorse decreased appetite and fatigue with increased sleeping for the past 3 d ?She started to feel better today, no hx of recurrent or complicated UTI ?Results for orders placed or performed in visit on 06/25/21  ?POCT Urinalysis Dipstick  ?Result Value Ref Range  ? Color, UA Yellow   ? Clarity, UA Cloudy   ? Glucose, UA Negative Negative  ? Bilirubin, UA Negative   ? Ketones, UA Negative   ? Spec Grav, UA 1.010 1.010 - 1.025  ? Blood, UA Large   ? pH, UA 5.0 5.0 - 8.0  ? Protein, UA Positive (A) Negative  ? Urobilinogen, UA 0.2 0.2 or 1.0 E.U./dL  ? Nitrite, UA Positive   ? Leukocytes, UA Large (3+) (A) Negative  ? Appearance Yellow   ? Odor Foul   ? ? ?Tachycardia with initial VS, rechecked and HR inproved to 96 ?No palpitations, CP, SOB, LE edema, near syncope ? ? ? ? ? ?Current Outpatient Medications:  ?  Multiple Vitamin (MULTIVITAMIN) tablet, Take 1 tablet by mouth daily., Disp: , Rfl:  ? ?Patient Active Problem List  ? Diagnosis Date Noted  ? Vitamin D deficiency 05/14/2017  ? 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 Obesity 03/31/2015  ? ? ?No past surgical history on file. ? ?Family History  ?Problem Relation Age of Onset  ? Scleroderma Mother   ? Aneurysm Father   ? Cerebral aneurysm Father   ? Hypertension Maternal Grandmother   ? Breast cancer Maternal Aunt   ?     Great Aunt  ? Breast cancer Cousin   ?     maternal  ? ? ?Social History  ? ?Tobacco Use  ? Smoking status: Never  ? Smokeless tobacco: Never  ? Tobacco comments:   ?  hx of past 2nd hand smoke exposure with exhusband (7)   ?Vaping Use  ? Vaping Use: Never used  ?Substance Use Topics  ? Alcohol use: No  ? Drug use: No  ?  ? ?No Known Allergies ? ?Health Maintenance  ?Topic Date Due  ? COLONOSCOPY (Pts 45-75yrs Insurance coverage will need to be confirmed)  Never done  ? PAP SMEAR-Modifier  06/29/2021  ? COVID-19 Vaccine (3 - Booster for Moderna series) 07/11/2021 (Originally 11/28/2019)  ? INFLUENZA VACCINE  07/13/2021 (Originally 11/13/2020)  ? Zoster Vaccines- Shingrix (1 of 2) 09/25/2021 (Originally 09/29/2010)  ? MAMMOGRAM  12/25/2021  ? TETANUS/TDAP  05/16/2029  ? Hepatitis C Screening  Completed  ? HIV Screening  Completed  ? HPV VACCINES  Aged Out  ? ? ?Chart Review Today: ?I personally reviewed active problem list, medication list, allergies, family history, social history, health maintenance, notes from last encounter, lab results, imaging with the patient/caregiver today. ?Reviewed past micro data ? ?Review of Systems  ?Constitutional: Negative.   ?HENT: Negative.    ?Eyes: Negative.   ?Respiratory: Negative.    ?Cardiovascular: Negative.   ?Gastrointestinal: Negative.   ?  Endocrine: Negative.   ?Genitourinary: Negative.   ?Musculoskeletal: Negative.   ?Skin: Negative.   ?Allergic/Immunologic: Negative.   ?Neurological: Negative.   ?Hematological: Negative.   ?Psychiatric/Behavioral: Negative.    ?All other systems reviewed and are negative.  ? ?Objective:  ? ?Vitals:  ? 06/25/21 1354  ?BP: 110/72  ?Pulse: (!) 117  ?Resp: 16  ?Temp: 98.6 ?F (37 ?C)  ?TempSrc: Oral  ?SpO2: 96%  ?Weight: 202 lb 8 oz (91.9 kg)  ?Height: 5' 6.5" (1.689 m)  ? ? ?Body mass index is 32.19 kg/m?. ? ?Pulse Readings from Last 3 Encounters:  ?06/25/21 (!) 117  ?06/29/20 83  ?05/17/19 98  ? ? ? ? ?Physical Exam ?Vitals and nursing note reviewed.  ?Constitutional:   ?   General: She is not in acute distress. ?   Appearance: She is obese. She is not ill-appearing, toxic-appearing or diaphoretic.  ?    Comments: Well appearing female  ?HENT:  ?   Head: Normocephalic and atraumatic.  ?   Right Ear: External ear normal.  ?   Left Ear: External ear normal.  ?Cardiovascular:  ?   Rate and Rhythm: Normal rate and regular rhythm.  ?   Pulses: Normal pulses.  ?   Heart sounds: Normal heart sounds. No murmur heard. ?  No friction rub. No gallop.  ?Pulmonary:  ?   Effort: Pulmonary effort is normal. No respiratory distress.  ?   Breath sounds: Normal breath sounds. No stridor. No wheezing, rhonchi or rales.  ?Abdominal:  ?   General: Bowel sounds are normal. There is no distension.  ?   Palpations: Abdomen is soft.  ?   Tenderness: There is no abdominal tenderness. There is no right CVA tenderness, left CVA tenderness, guarding or rebound.  ?Skin: ?   General: Skin is warm and dry.  ?   Coloration: Skin is not jaundiced or pale.  ?Neurological:  ?   Mental Status: She is alert. Mental status is at baseline.  ?   Gait: Gait normal.  ?Psychiatric:     ?   Mood and Affect: Mood normal.  ?  ? ? ? ? ?Assessment & Plan:  ? ?  ICD-10-CM   ?1. Acute cystitis with hematuria  N30.01 POCT Urinalysis Dipstick  ?  cephALEXin (KEFLEX) 500 MG capsule  ? sx and dip consistent with simple UTI, abd exam benign, pt well appearing, treat empirically with keflex  ?  ?2. Fatigue, unspecified type  R53.83   ? only 3 d of sx, along with her urinary frequency, will tx UTI and expect fatigue sx to resolve, she has appointment in ~2 weeks, will f/up   ?  ? ?Discussed option to add urine culture - pt has no indication (preg, elderly, immunocompromised, hx of recurrent UTI) offered to add culture if she wanted, but declined at this time.  Pt can f/up if not feeling better in ~3 d and we'll repeat dip and add culture. ? ?Given work note  ? ?Danelle Berry, PA-C ?06/25/21 2:00 PM ? ? ?

## 2021-07-27 NOTE — Patient Instructions (Incomplete)

## 2021-07-30 ENCOUNTER — Encounter: Payer: No Typology Code available for payment source | Admitting: Family Medicine

## 2021-07-30 DIAGNOSIS — Z0000 Encounter for general adult medical examination without abnormal findings: Secondary | ICD-10-CM

## 2021-07-30 DIAGNOSIS — Z124 Encounter for screening for malignant neoplasm of cervix: Secondary | ICD-10-CM

## 2021-08-15 ENCOUNTER — Encounter: Payer: No Typology Code available for payment source | Admitting: Family Medicine

## 2021-08-16 ENCOUNTER — Encounter: Payer: No Typology Code available for payment source | Admitting: Family Medicine

## 2021-09-12 ENCOUNTER — Encounter: Payer: Self-pay | Admitting: Family Medicine

## 2021-09-12 ENCOUNTER — Ambulatory Visit (INDEPENDENT_AMBULATORY_CARE_PROVIDER_SITE_OTHER): Payer: No Typology Code available for payment source | Admitting: Family Medicine

## 2021-09-12 ENCOUNTER — Other Ambulatory Visit (HOSPITAL_COMMUNITY)
Admission: RE | Admit: 2021-09-12 | Discharge: 2021-09-12 | Disposition: A | Discharge status: Home / Self Care | Payer: No Typology Code available for payment source | Source: Ambulatory Visit | Attending: Family Medicine | Admitting: Family Medicine

## 2021-09-12 VITALS — BP 130/82 | HR 88 | Temp 98.0°F | Resp 16 | Ht 66.0 in | Wt 202.0 lb

## 2021-09-12 DIAGNOSIS — Z113 Encounter for screening for infections with a predominantly sexual mode of transmission: Secondary | ICD-10-CM

## 2021-09-12 DIAGNOSIS — Z1211 Encounter for screening for malignant neoplasm of colon: Secondary | ICD-10-CM

## 2021-09-12 DIAGNOSIS — Z0000 Encounter for general adult medical examination without abnormal findings: Secondary | ICD-10-CM | POA: Diagnosis not present

## 2021-09-12 DIAGNOSIS — R87810 Cervical high risk human papillomavirus (HPV) DNA test positive: Secondary | ICD-10-CM

## 2021-09-12 DIAGNOSIS — Z1231 Encounter for screening mammogram for malignant neoplasm of breast: Secondary | ICD-10-CM | POA: Diagnosis not present

## 2021-09-12 DIAGNOSIS — Z7251 High risk heterosexual behavior: Secondary | ICD-10-CM

## 2021-09-12 DIAGNOSIS — Z6832 Body mass index (BMI) 32.0-32.9, adult: Secondary | ICD-10-CM

## 2021-09-12 DIAGNOSIS — E669 Obesity, unspecified: Secondary | ICD-10-CM

## 2021-09-12 NOTE — Patient Instructions (Signed)
Health Maintenance  Topic Date Due   Colon Cancer Screening  Never done   Pap Smear  06/29/2021   Zoster (Shingles) Vaccine (1 of 2) 09/25/2021*   COVID-19 Vaccine (3 - Booster for Moderna series) 09/28/2021*   Flu Shot  11/13/2021   Mammogram  12/25/2021   Tetanus Vaccine  05/16/2029   Hepatitis C Screening: USPSTF Recommendation to screen - Ages 18-61 yo.  Completed   HIV Screening  Completed   HPV Vaccine  Aged Out  *Topic was postponed. The date shown is not the original due date.   Call to schedule your mammogram [facility] at [address],  [place]  [id number] Get Driving Directions Main: [phone]  Preventive Care 17-43 Years Old, Female Preventive care refers to lifestyle choices and visits with your health care provider that can promote health and wellness. Preventive care visits are also called wellness exams. What can I expect for my preventive care visit? Counseling Your health care provider may ask you questions about your: Medical history, including: Past medical problems. Family medical history. Pregnancy history. Current health, including: Menstrual cycle. Method of birth control. Emotional well-being. Home life and relationship well-being. Sexual activity and sexual health. Lifestyle, including: Alcohol, nicotine or tobacco, and drug use. Access to firearms. Diet, exercise, and sleep habits. Work and work Statistician. Sunscreen use. Safety issues such as seatbelt and bike helmet use. Physical exam Your health care provider will check your: Height and weight. These may be used to calculate your BMI (body mass index). BMI is a measurement that tells if you are at a healthy weight. Waist circumference. This measures the distance around your waistline. This measurement also tells if you are at a healthy weight and may help predict your risk of certain diseases, such as type 2 diabetes and high blood  pressure. Heart rate and blood pressure. Body temperature. Skin for abnormal spots. What immunizations do I need?  Vaccines are usually given at various ages, according to a schedule. Your health care provider will recommend vaccines for you based on your age, medical history, and lifestyle or other factors, such as travel or where you work. What tests do I need? Screening Your health care provider may recommend screening tests for certain conditions. This may include: Lipid and cholesterol levels. Diabetes screening. This is done by checking your blood sugar (glucose) after you have not eaten for a while (fasting). Pelvic exam and Pap test. Hepatitis B test. Hepatitis C test. HIV (human immunodeficiency virus) test. STI (sexually transmitted infection) testing, if you are at risk. Lung cancer screening. Colorectal cancer screening. Mammogram. Talk with your health care provider about when you should start having regular mammograms. This may depend on whether you have a family history of breast cancer. BRCA-related cancer screening. This may be done if you have a family history of breast, ovarian, tubal, or peritoneal cancers. Bone density scan. This is done to screen for osteoporosis. Talk with your health care provider about your test results, treatment options, and if necessary, the need for more tests. Follow these instructions at home: Eating and drinking  Eat a diet that includes fresh fruits and vegetables, whole grains, lean protein, and low-fat dairy products. Take vitamin and mineral supplements as recommended by your health care provider. Do not drink alcohol if: Your health care provider tells you not to drink. You are pregnant, may be pregnant, or are planning to become pregnant. If you drink alcohol: Limit how much you  have to 0-1 drink a day. Know how much alcohol is in your drink. In the [place] one drink equals one 12 oz bottle of beer (355 mL), one 5 oz glass of wine  (148 mL), or one 1 oz glass of hard liquor (44 mL). Lifestyle Brush your teeth every morning and night with fluoride toothpaste. Floss one time each day. Exercise for at least 30 minutes 5 or more days each week. Do not use any products that contain nicotine or tobacco. These products include cigarettes, chewing tobacco, and vaping devices, such as e-cigarettes. If you need help quitting, ask your health care provider. Do not use drugs. If you are sexually active, practice safe sex. Use a condom or other form of protection to prevent STIs. If you do not wish to become pregnant, use a form of birth control. If you plan to become pregnant, see your health care provider for a prepregnancy visit. Take aspirin only as told by your health care provider. Make sure that you understand how much to take and what form to take. Work with your health care provider to find out whether it is safe and beneficial for you to take aspirin daily. Find healthy ways to manage stress, such as: Meditation, yoga, or listening to music. Journaling. Talking to a trusted person. Spending time with friends and family. Minimize exposure to UV radiation to reduce your risk of skin cancer. Safety Always wear your seat belt while driving or riding in a vehicle. Do not drive: If you have been drinking alcohol. Do not ride with someone who has been drinking. When you are tired or distracted. While texting. If you have been using any mind-altering substances or drugs. Wear a helmet and other protective equipment during sports activities. If you have firearms in your house, make sure you follow all gun safety procedures. Seek help if you have been physically or sexually abused. What's next? Visit your health care provider once a year for an annual wellness visit. Ask your health care provider how often you should have your eyes and teeth checked. Stay up to date on all vaccines. This information is not intended to replace  advice given to you by your health care provider. Make sure you discuss any questions you have with your health care provider. Document Revised: 09/27/2020 Document Reviewed: 09/27/2020 Elsevier Patient Education  [place].

## 2021-09-12 NOTE — Progress Notes (Signed)
Patient: Gloria Fuller, Female    DOB: [birth year], 61 y.o.   MRN: [id number] Delsa Grana, PA-C Visit Date: 09/12/2021  Today's Provider: Delsa Grana, PA-C   Chief Complaint  Patient presents with   Annual Exam   Subjective:   Annual physical exam:  Gloria Fuller is a 61 y.o. female who presents today for complete physical exam:  Exercise/Activity: walk all day Diet/nutrition:  eat fruits veggies drink water - everything in moderation  Sleep: sleeping enough, but third shift    SDOH Screenings   Alcohol Screen: Not on file  Depression (PHQ2-9): Low Risk    PHQ-2 Score: 0  Financial Resource Strain: Low Risk    Difficulty of Paying Living Expenses: Not hard at all  Food Insecurity: No Food Insecurity   Worried About Charity fundraiser in the Last Year: Never true   Ran Out of Food in the Last Year: Never true  Housing: Low Risk    Last Housing Risk Score: 0  Physical Activity: Sufficiently Active   Days of Exercise per Week: 5 days   Minutes of Exercise per Session: 40 min  Social Connections: Moderately Integrated   Frequency of Communication with Friends and Family: More than three times a week   Frequency of Social Gatherings with Friends and Family: More than three times a week   Attends Religious Services: More than 4 times per year   Active Member of Clubs or Organizations: Yes   Attends Archivist Meetings: 1 to 4 times per year   Marital Status: Divorced  Stress: No Stress Concern Present   Feeling of Stress : Not at all  Tobacco Use: Low Risk    Smoking Tobacco Use: Never   Smokeless Tobacco Use: Never   Passive Exposure: Not on file  Transportation Needs: No Transportation Needs   Lack of Transportation (Medical): No   Lack of Transportation (Non-Medical): No     USPSTF grade A and B recommendations - reviewed and addressed today  Depression:  Phq 9 completed today by patient, was reviewed by me with patient in the room PHQ score is  neg, pt feels good    09/12/2021    3:09 PM 06/25/2021    1:42 PM 06/29/2020    2:27 PM 05/17/2019    8:18 AM  PHQ 2/9 Scores  PHQ - 2 Score 0 0 0 0  PHQ- 9 Score  0 0 1      09/12/2021    3:09 PM 06/25/2021    1:42 PM 06/29/2020    2:27 PM 05/17/2019    8:18 AM 04/17/2018    8:56 AM  Depression screen PHQ 2/9  Decreased Interest 0 0 0 0 0  Down, Depressed, Hopeless 0 0 0 0 0  PHQ - 2 Score 0 0 0 0 0  Altered sleeping  0 0 0   Tired, decreased energy  0 0 0   Change in appetite  0 0 1   Feeling bad or failure about yourself   0 0 0   Trouble concentrating  0 0 0   Moving slowly or fidgety/restless  0 0 0   Suicidal thoughts  0 0 0   PHQ-9 Score  0 0 1   Difficult doing work/chores  Not difficult at all  Not difficult at all     Alcohol screening: [place] Office Visit from 05/17/2019 in [facility]  AUDIT-C Score 0  Immunizations and Health Maintenance: Health Maintenance  Topic Date Due   COLONOSCOPY (Pts 45-34yr Insurance coverage will need to be confirmed)  Never done   PAP SMEAR-Modifier  06/29/2021   Zoster Vaccines- Shingrix (1 of 2) 09/25/2021 (Originally 09/29/2010)   COVID-19 Vaccine (3 - Booster for Moderna series) 09/28/2021 (Originally 11/28/2019)   INFLUENZA VACCINE  11/13/2021   MAMMOGRAM  12/25/2021   TETANUS/TDAP  05/16/2029   Hepatitis C Screening  Completed   HIV Screening  Completed   HPV VACCINES  Aged Out     Hep C Screening: done  STD testing and prevention (HIV/chl/gon/syphilis):  will screen  Intimate partner violence:safe  Sexual History/Pain during Intercourse: Divorced, sexually active, no pain with sex, not using condoms   Menstrual History/LMP/Abnormal Bleeding: postmenopausal 6 Patient's last menstrual period was 09/03/2015.  Incontinence Symptoms:  very mild  Breast cancer:  due soone Last Mammogram: *see HM list above BRCA gene screening: none known  Cervical cancer screening: HPV positive x 2 with  normal pap Pt family hx of cancers - breast, ovarian, uterine, colon:     Osteoporosis:   Discussion on osteoporosis per age, including high calcium and vitamin D supplementation, weight bearing exercises Pt is not currently supplementing with daily calcium/Vit D.   Skin cancer:  Hx of skin CA -  NO Discussed atypical lesions   Colorectal cancer:   Colonoscopy is due - has not been able to arrange colonoscopy will do cologuard Discussed concerning signs and sx of CRC, pt denies due  Lung cancer:   Low Dose CT Chest recommended if Age 338-80years, 20 pack-year currently smoking OR have quit w/in 15years. Patient does not qualify.    Social History   Tobacco Use   Smoking status: Never   Smokeless tobacco: Never   Tobacco comments:    hx of past 2nd hand smoke exposure with exhusband (7)   Vaping Use   Vaping Use: Never used  Substance Use Topics   Alcohol use: No   Drug use: No     Flowsheet Row [facility]-C Score 0       Family History  Problem Relation Age of Onset   Scleroderma Mother    Aneurysm Father    Cerebral aneurysm Father    Hypertension Maternal Grandmother    Breast cancer Maternal Aunt        Great Aunt   Breast cancer Cousin        maternal     Blood pressure/Hypertension: BP Readings from Last 3 Encounters:  09/12/21 130/82  06/25/21 110/72  06/29/20 124/72    Weight/Obesity: Wt Readings from Last 3 Encounters:  09/12/21 202 lb (91.6 kg)  06/25/21 202 lb 8 oz (91.9 kg)  06/29/20 206 lb 4.8 oz (93.6 kg)   BMI Readings from Last 3 Encounters:  09/12/21 32.60 kg/m  06/25/21 32.19 kg/m  06/29/20 32.31 kg/m     Lipids:  Lab Results  Component Value Date   CHOL 109 06/29/2020   CHOL 107 05/17/2019   CHOL 116 05/13/2017   Lab Results  Component Value Date   HDL 57 06/29/2020   HDL 53 05/17/2019   HDL 69 05/13/2017   Lab Results  Component Value Date   LDLCALC 38  06/29/2020   LDLCALC 40 05/17/2019   LDLCALC 33 05/13/2017   Lab Results  Component Value Date   TRIG 65 06/29/2020   TRIG 56 05/17/2019   TRIG 60  05/13/2017   Lab Results  Component Value Date   CHOLHDL 1.9 06/29/2020   CHOLHDL 2.0 05/17/2019   CHOLHDL 1.7 05/13/2017   No results found for: LDLDIRECT Based on the results of lipid panel his/her cardiovascular risk factor ( using [facility] )  in the next 10 years is: The ASCVD Risk score (Arnett DK, et al., 2019) failed to calculate for the following reasons:   The valid total cholesterol range is 130 to 320 mg/dL  Glucose:  Glucose  Date Value Ref Range Status  07/15/2011 75 65 - 99 mg/dL Final   Glucose, Bld  Date Value Ref Range Status  06/29/2020 80 65 - 99 mg/dL Final    Comment:    .            Fasting reference interval .   05/17/2019 85 65 - 99 mg/dL Final    Comment:    .            Fasting reference interval .   11/27/2017 117 (H) 70 - 99 mg/dL Final    Advanced Care Planning:  A voluntary discussion about advance care planning including the explanation and discussion of advance directives.   Discussed health care proxy and Living will, and the patient was able to identify a health care proxy as Son - Zuly Belkin.   Patient does not have a living will at present time.   Social History       Social History   Socioeconomic History   Marital status: Divorced    Spouse name: Not on file   Number of children: Not on file   Years of education: Not on file   Highest education level: Not on file  Occupational History   Not on file  Tobacco Use   Smoking status: Never   Smokeless tobacco: Never   Tobacco comments:    hx of past 2nd hand smoke exposure with exhusband (7)   Vaping Use   Vaping Use: Never used  Substance and Sexual Activity   Alcohol use: No   Drug use: No   Sexual activity: Yes    Partners: Male    Birth control/protection: Post-menopausal  Other Topics Concern   Not on file   Social History Narrative          Social Determinants of Health   Financial Resource Strain: Low Risk    Difficulty of Paying Living Expenses: Not hard at all  Food Insecurity: No Food Insecurity   Worried About Charity fundraiser in the Last Year: Never true   [place] in the Last Year: Never true  Transportation Needs: No Transportation Needs   Lack of Transportation (Medical): No   Lack of Transportation (Non-Medical): No  Physical Activity: Sufficiently Active   Days of Exercise per Week: 5 days   Minutes of Exercise per Session: 40 min  Stress: No Stress Concern Present   Feeling of Stress : Not at all  Social Connections: Moderately Integrated   Frequency of Communication with Friends and Family: More than three times a week   Frequency of Social Gatherings with Friends and Family: More than three times a week   Attends Religious Services: More than 4 times per year   Active Member of [employer] or Organizations: Yes   Attends Archivist Meetings: 1 to 4 times per year   Marital Status: Divorced    Family History        Family History  Problem  Relation Age of Onset   Scleroderma Mother    Aneurysm Father    Cerebral aneurysm Father    Hypertension Maternal Grandmother    Breast cancer Maternal Aunt        Great Aunt   Breast cancer Cousin        maternal    Patient Active Problem List   Diagnosis Date Noted   Cervical high risk HPV (human papillomavirus) test positive 09/12/2021   Vitamin D deficiency 05/14/2017   Shifting sleep-work schedule 05/13/2017   Obesity 03/31/2015    History reviewed. No pertinent surgical history.  No current outpatient medications on file.  No Known Allergies  Patient Care Team: Delsa Grana, PA-C as PCP - General (Family Medicine) Ashok Norris, MD (Family Medicine)   Chart Review: I personally reviewed active problem list, medication list, allergies, family history, social history, health maintenance,  notes from last encounter, lab results, imaging with the patient/caregiver today.   Review of Systems  Constitutional: Negative.   HENT: Negative.    Eyes: Negative.   Respiratory: Negative.    Cardiovascular: Negative.   Gastrointestinal: Negative.   Endocrine: Negative.   Genitourinary: Negative.   Musculoskeletal: Negative.   Skin: Negative.   Allergic/Immunologic: Negative.   Neurological: Negative.   Hematological: Negative.   Psychiatric/Behavioral: Negative.    All other systems reviewed and are negative.        Objective:   Vitals:  Vitals:   09/12/21 1510  BP: 130/82  Pulse: 88  Resp: 16  Temp: 98 F (36.7 C)  SpO2: 98%  Weight: 202 lb (91.6 kg)  Height: '5\' 6"'  (1.676 m)    Body mass index is 32.6 kg/m.  Physical Exam Vitals and nursing note reviewed. Exam conducted with a chaperone present.  Constitutional:      General: She is not in acute distress.    Appearance: Normal appearance. She is well-developed and overweight. She is not ill-appearing, toxic-appearing or diaphoretic.     Interventions: Face mask in place.  HENT:     Head: Normocephalic and atraumatic.     Right Ear: External ear normal.     Left Ear: External ear normal.     Mouth/Throat:     Pharynx: Uvula midline.  Eyes:     General: Lids are normal.        Right eye: No discharge.        Left eye: No discharge.     Conjunctiva/sclera: Conjunctivae normal.  Neck:     Trachea: Phonation normal. No tracheal deviation.  Cardiovascular:     Rate and Rhythm: Normal rate and regular rhythm.     Pulses: Normal pulses.          Radial pulses are 2+ on the right side and 2+ on the left side.       Posterior tibial pulses are 2+ on the right side and 2+ on the left side.     Heart sounds: Normal heart sounds. No murmur heard.   No friction rub. No gallop.  Pulmonary:     Effort: Pulmonary effort is normal. No respiratory distress.     Breath sounds: Normal breath sounds. No stridor. No  wheezing, rhonchi or rales.  Chest:     Chest wall: No tenderness.  Abdominal:     General: Bowel sounds are normal. There is no distension.     Palpations: Abdomen is soft.     Tenderness: There is no abdominal tenderness. There is no right CVA tenderness,  left CVA tenderness, guarding or rebound.  Genitourinary:    General: Normal vulva.     Vagina: Normal. No vaginal discharge, erythema, tenderness or lesions.     Cervix: Friability, erythema, cervical bleeding and eversion present. No cervical motion tenderness or discharge.     Uterus: Normal.      Adnexa: Right adnexa normal and left adnexa normal.     Comments: PAP obtained with broom Some vaginal wall relaxation Musculoskeletal:        General: No tenderness.     Cervical back: Normal range of motion.     Right lower leg: No edema.     Left lower leg: No edema.  Lymphadenopathy:     Cervical: No cervical adenopathy.  Skin:    General: Skin is warm and dry.     Coloration: Skin is not jaundiced or pale.     Findings: No rash.  Neurological:     Mental Status: She is alert. Mental status is at baseline.     Motor: No abnormal muscle tone.     Gait: Gait normal.  Psychiatric:        Mood and Affect: Mood normal.        Speech: Speech normal.        Behavior: Behavior normal. Behavior is cooperative.      Fall Risk:    09/12/2021    3:09 PM 06/25/2021    1:42 PM 06/29/2020    2:27 PM 05/17/2019    8:18 AM 04/17/2018    8:56 AM  Fall Risk   Falls in the past year? 0 0 0 0   Number falls in past yr: 0 0 0  0  Injury with Fall? 0 0 0  0  Risk for fall due to : No Fall Risks No Fall Risks     Follow up Falls prevention discussed Falls prevention discussed       Functional Status Survey: Is the patient deaf or have difficulty hearing?: No Does the patient have difficulty seeing, even when wearing glasses/contacts?: No Does the patient have difficulty concentrating, remembering, or making decisions?: No Does the  patient have difficulty walking or climbing stairs?: No Does the patient have difficulty dressing or bathing?: No Does the patient have difficulty doing errands alone such as visiting a doctor's office or shopping?: No   Assessment & Plan:    CPE completed today  USPSTF grade A and B recommendations reviewed with patient; age-appropriate recommendations, preventive care, screening tests, etc discussed and encouraged; healthy living encouraged; see AVS for patient education given to patient  Discussed importance of 150 minutes of physical activity weekly, AHA exercise recommendations given to pt in AVS/handout  Discussed importance of healthy diet:  eating lean meats and proteins, avoiding trans fats and saturated fats, avoid simple sugars and excessive carbs in diet, eat 6 servings of fruit/vegetables daily and drink plenty of water and avoid sweet beverages.    Recommended pt to do annual eye exam and routine dental exams/cleanings  Depression, alcohol, fall screening completed as documented above and per flowsheets  Advance Care planning information and packet discussed and offered today, encouraged pt to discuss with family members/spouse/partner/friends and complete Advanced directive packet and bring copy to office   Reviewed Health Maintenance: Health Maintenance  Topic Date Due   COLONOSCOPY (Pts 45-37yr Insurance coverage will need to be confirmed)  Never done   PAP SMEAR-Modifier  06/29/2021   Zoster Vaccines- Shingrix (1 of 2) 09/25/2021 (Originally 09/29/2010)  COVID-19 Vaccine (3 - Booster for Moderna series) 09/28/2021 (Originally 11/28/2019)   INFLUENZA VACCINE  11/13/2021   MAMMOGRAM  12/25/2021   TETANUS/TDAP  05/16/2029   Hepatitis C Screening  Completed   HIV Screening  Completed   HPV VACCINES  Aged Out    Immunizations: Immunization History  Administered Date(s) Administered   [employer] Vaccination 09/05/2019, 10/03/2019   Tdap 05/17/2019    Vaccines:   Shingrix: 55-64 yo and ask insurance if covered when patient above 78 yo - DUE  Pneumonia: not due Flu: out of season    ICD-10-CM   1. Adult general medical exam  Z00.00 CBC with Differential/Platelet    COMPLETE METABOLIC PANEL WITH GFR    Lipid panel    Hemoglobin A1c    2. Cervical high risk HPV (human papillomavirus) test positive  R87.810 Cytology - PAP    Ambulatory referral to Gynecology    CANCELED: Cytology - PAP   high risk HPV + in 2019, 2022 with normal pap, recheck today but believe based on algorithm she needs to see GYN for colposcopy     3. Breast cancer screening by mammogram  Z12.31 MM 3D SCREEN BREAST BILATERAL    4. Screening for malignant neoplasm of colon  Z12.11 Cologuard    5. Class 1 obesity with body mass index (BMI) of 32.0 to 32.9 in adult, unspecified obesity type, unspecified whether serious comorbidity present  E66.9    Z68.32     6. Screening examination for STD (sexually transmitted disease)  Z11.3 HIV Antibody (routine testing w rflx)    RPR    7. High risk sexual behavior, unspecified type  Z72.51 Ambulatory referral to Gynecology   encouraged to use condoms/barrier methods, more then 1 partner in the past year, not using condoms          Delsa Grana, PA-C 09/12/21 3:28 PM  [place]

## 2021-09-13 LAB — CBC WITH DIFFERENTIAL/PLATELET
Absolute Monocytes: 600 cells/uL (ref 200–950)
Basophils Absolute: 53 cells/uL (ref 0–200)
Basophils Relative: 0.7 %
Eosinophils Absolute: 167 cells/uL (ref 15–500)
Eosinophils Relative: 2.2 %
HCT: 38.1 % (ref 35.0–45.0)
Hemoglobin: 12.6 g/dL (ref 11.7–15.5)
Lymphs Abs: 2911 cells/uL (ref 850–3900)
MCH: 29 pg (ref 27.0–33.0)
MCHC: 33.1 g/dL (ref 32.0–36.0)
MCV: 87.6 fL (ref 80.0–100.0)
MPV: 10 fL (ref 7.5–12.5)
Monocytes Relative: 7.9 %
Neutro Abs: 3868 cells/uL (ref 1500–7800)
Neutrophils Relative %: 50.9 %
Platelets: 293 10*3/uL (ref 140–400)
RBC: 4.35 10*6/uL (ref 3.80–5.10)
RDW: 13.9 % (ref 11.0–15.0)
Total Lymphocyte: 38.3 %
WBC: 7.6 10*3/uL (ref 3.8–10.8)

## 2021-09-13 LAB — LIPID PANEL
Cholesterol: 111 mg/dL (ref <200)
HDL: 54 mg/dL (ref >=50)
LDL Cholesterol (Calc): 42 mg/dL (calc)
Non-HDL Cholesterol (Calc): 57 mg/dL (calc) (ref <130)
Total CHOL/HDL Ratio: 2.1 (calc) (ref <5.0)
Triglycerides: 71 mg/dL (ref <150)

## 2021-09-13 LAB — COMPLETE METABOLIC PANEL WITH GFR
AG Ratio: 1.2 (calc) (ref 1.0–2.5)
ALT: 11 U/L (ref 6–29)
AST: 14 U/L (ref 10–35)
Albumin: 4.1 g/dL (ref 3.6–5.1)
Alkaline phosphatase (APISO): 76 U/L (ref 37–153)
BUN: 15 mg/dL (ref 7–25)
CO2: 28 mmol/L (ref 20–32)
Calcium: 10 mg/dL (ref 8.6–10.4)
Chloride: 103 mmol/L (ref 98–110)
Creat: 0.81 mg/dL (ref 0.50–1.05)
Globulin: 3.5 g/dL (calc) (ref 1.9–3.7)
Glucose, Bld: 84 mg/dL (ref 65–99)
Potassium: 3.8 mmol/L (ref 3.5–5.3)
Sodium: 139 mmol/L (ref 135–146)
Total Bilirubin: 0.4 mg/dL (ref 0.2–1.2)
Total Protein: 7.6 g/dL (ref 6.1–8.1)
eGFR: 83 mL/min/{1.73_m2} (ref >=60)

## 2021-09-13 LAB — HEMOGLOBIN A1C
Hgb A1c MFr Bld: 5 % of total Hgb (ref <5.7)
Mean Plasma Glucose: 97 mg/dL
eAG (mmol/L): 5.4 mmol/L

## 2021-09-13 LAB — RPR: RPR Ser Ql: NONREACTIVE

## 2021-09-13 LAB — HIV ANTIBODY (ROUTINE TESTING W REFLEX): HIV 1&2 Ab, 4th Generation: NONREACTIVE

## 2021-09-18 LAB — CYTOLOGY - PAP
Chlamydia: NEGATIVE
Comment: NEGATIVE
Comment: NEGATIVE
Comment: NEGATIVE
Comment: NEGATIVE
Comment: NEGATIVE
Comment: NORMAL
Diagnosis: UNDETERMINED — AB
HPV 16: NEGATIVE
HPV 18 / 45: POSITIVE — AB
High risk HPV: POSITIVE — AB
Neisseria Gonorrhea: NEGATIVE
Trichomonas: NEGATIVE

## 2021-09-19 ENCOUNTER — Ambulatory Visit: Payer: Self-pay

## 2021-09-19 NOTE — Telephone Encounter (Signed)
  Chief Complaint: Information only Symptoms:  Frequency:  Pertinent Negatives: Patient denies  Disposition: [] ED /[] Urgent Care (no appt availability in office) / [] Appointment(In office/virtual)/ []  [place] [facility]/ [] Home Care/ [] Refused Recommended Disposition /[] [place] [facility]/ []  Follow-up with PCP Additional Notes: Pt needed information regarding referral to Gyn - information given.  Reason for Disposition  Health Information question, no triage required and triager able to answer question  Answer Assessment - Initial Assessment Questions 1. REASON FOR CALL or QUESTION: "What is your reason for calling today?" or "How can I best help you?" or "What question do you have that I can help answer?"     Pt wanted more information regarding recent PAP. Gave information regarding referral to Dr. at [place].  Protocols used: Information Only Call - No Triage-A-AH

## 2021-11-16 LAB — COLOGUARD: COLOGUARD: NEGATIVE

## 2022-02-12 IMAGING — MG MM DIGITAL SCREENING BILAT W/ TOMO AND CAD
8 series · 8 of 24 positions shown · non-contrast
Comparison: Previous exam(s).

CLINICAL DATA: Screening.

EXAM:
DIGITAL SCREENING BILATERAL MAMMOGRAM WITH TOMOSYNTHESIS AND CAD
TECHNIQUE: Bilateral screening digital craniocaudal and mediolateral oblique
mammograms were obtained. Bilateral screening digital breast
tomosynthesis was performed. The images were evaluated with
computer-aided detection.

[L MLO synth-2D]
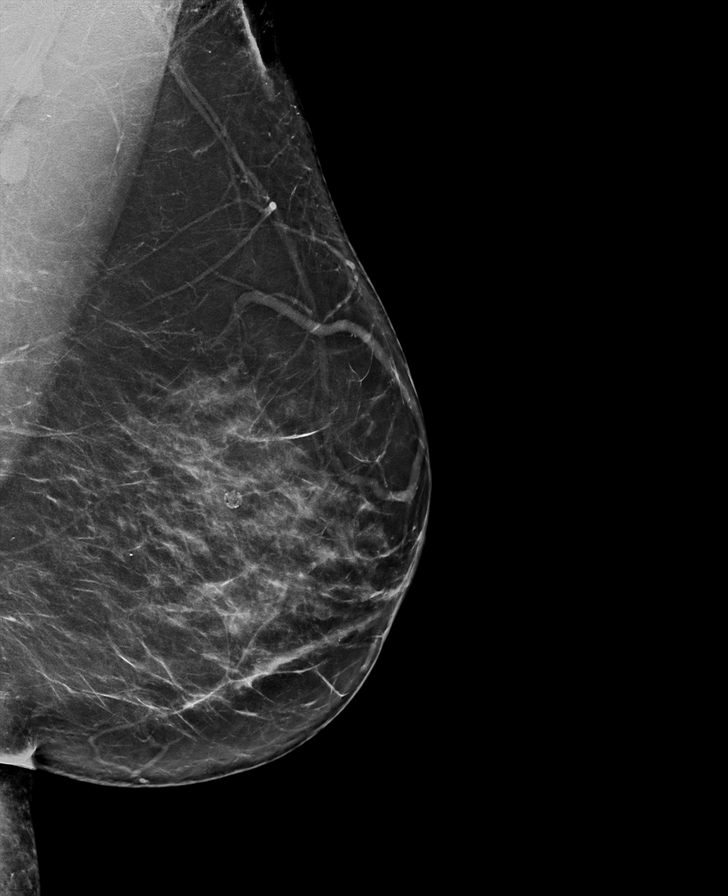

[R MLO synth-2D]
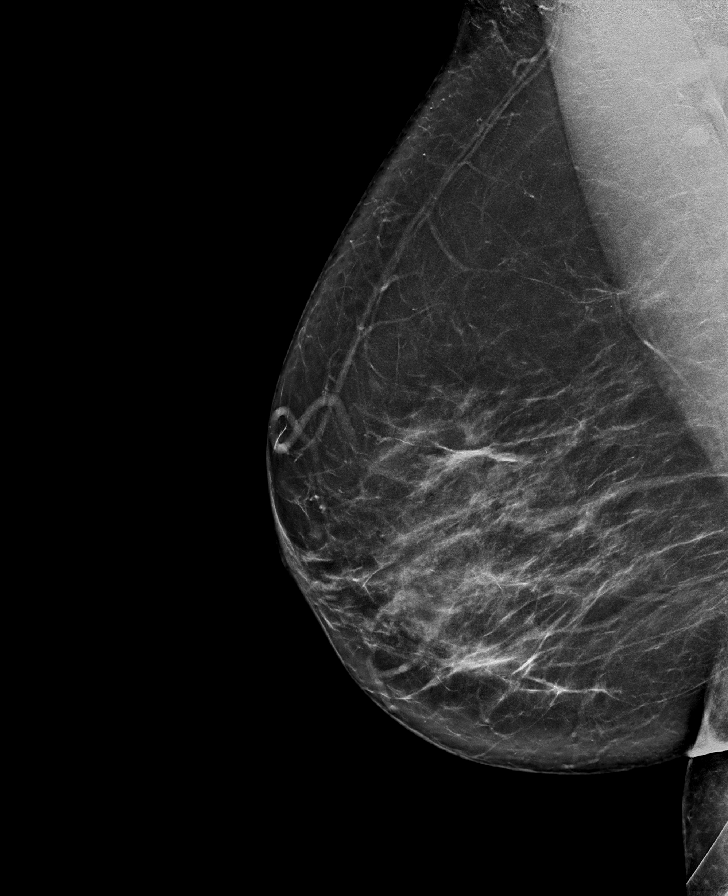

[R CC synth-2D]
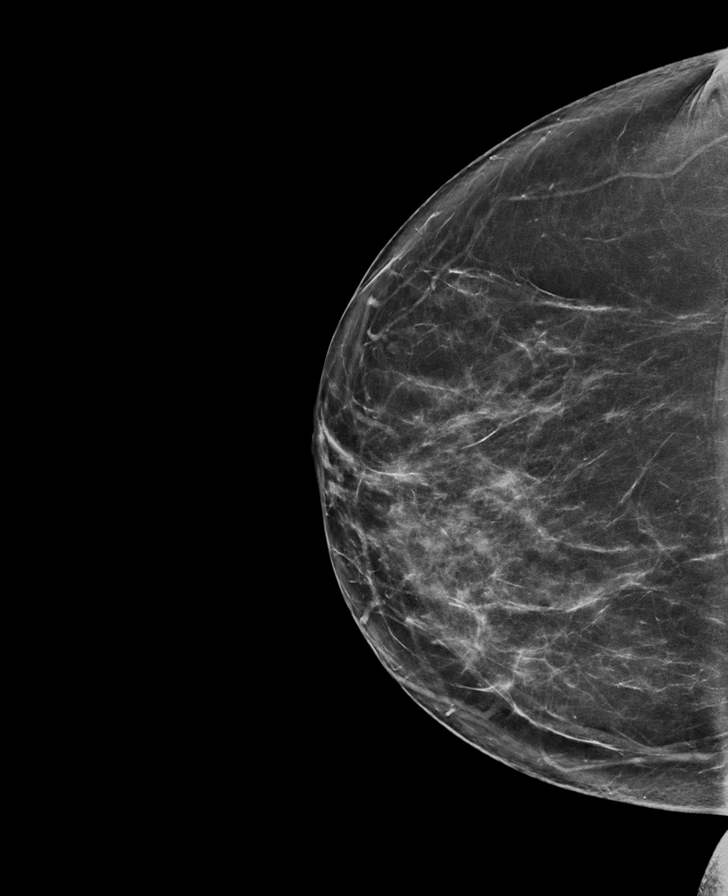

[L CC synth-2D]
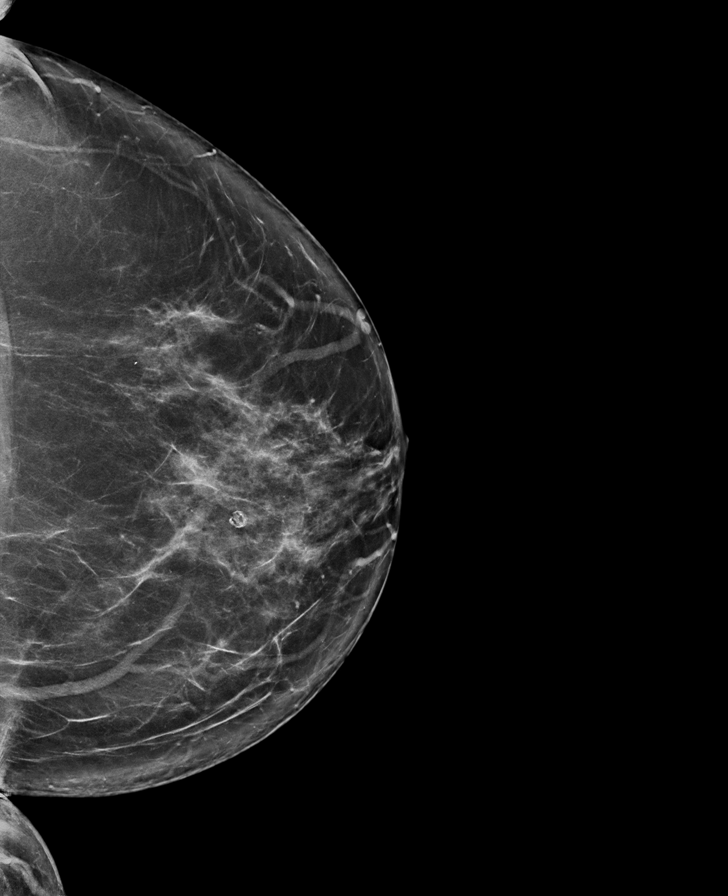

[L MLO tomo · tomo slice 43/84.0]
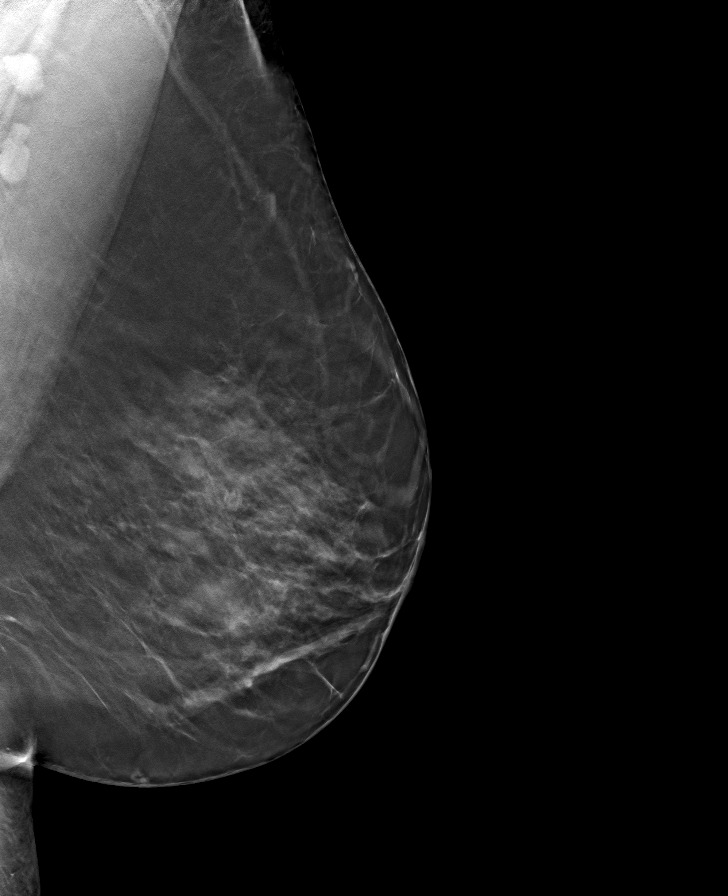

[R MLO tomo · tomo slice 43/84.0]
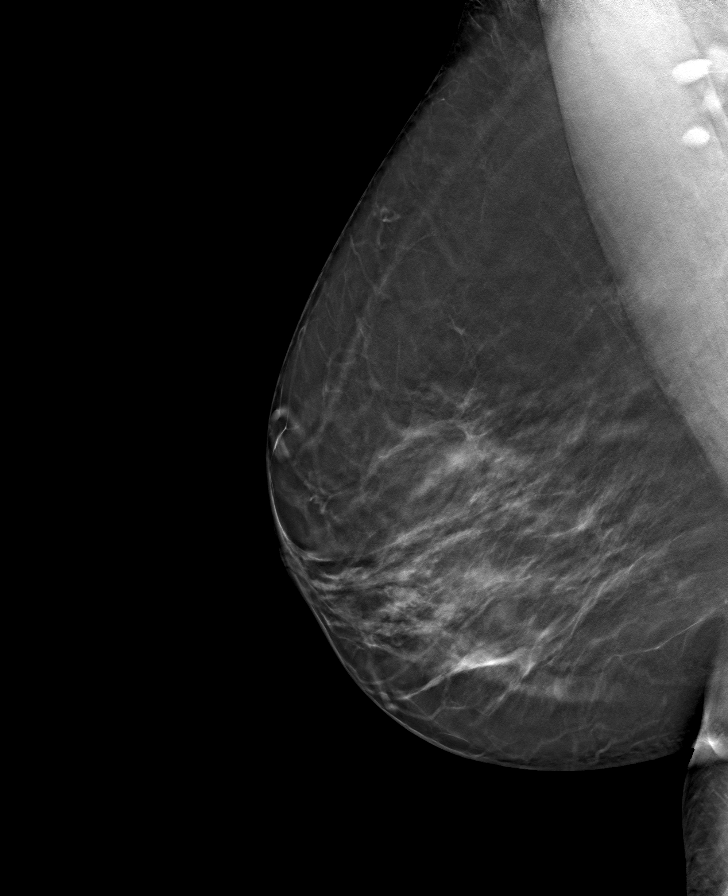

[R CC tomo · tomo slice 41/81.0]
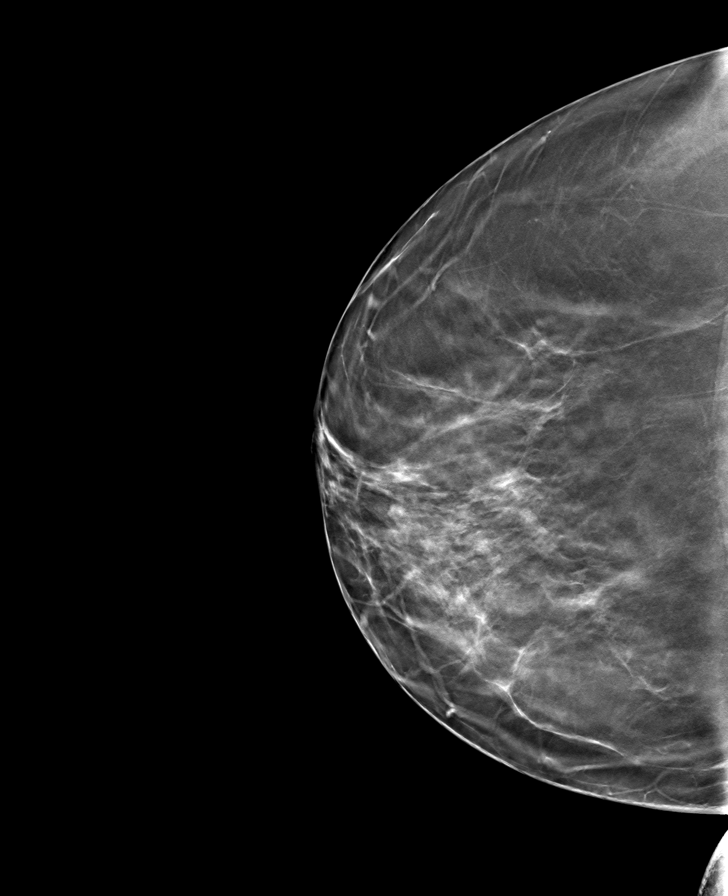

[L CC tomo · tomo slice 43/84.0]
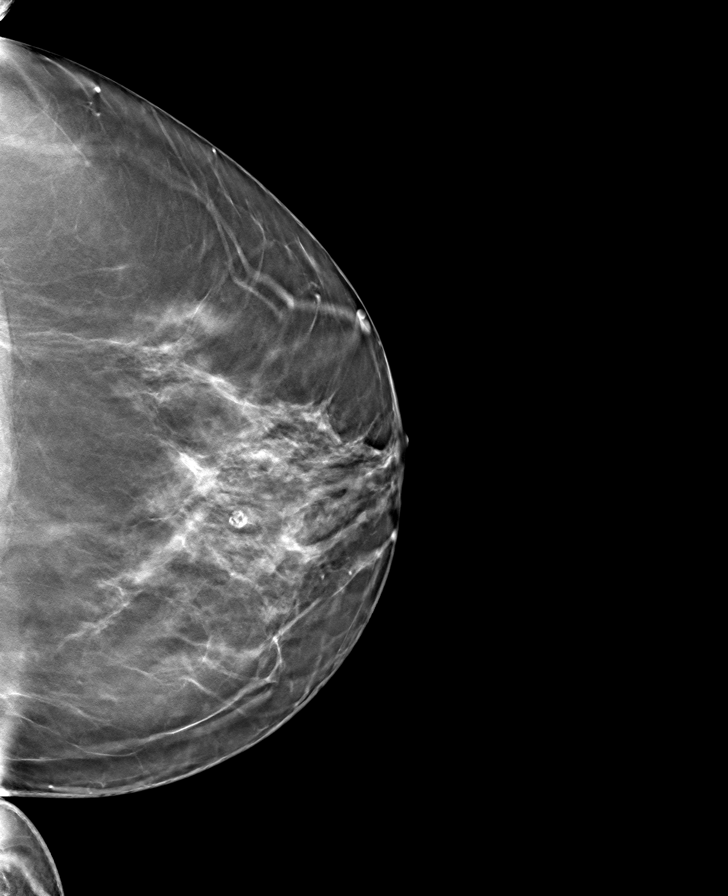

[8 of 24 positions shown; findings below may reference images not displayed]

ACR Breast Density Category b: There are scattered areas of
fibroglandular density.
FINDINGS: There are no findings suspicious for malignancy.
IMPRESSION: No mammographic evidence of malignancy. A result letter of this
screening mammogram will be mailed directly to the patient.

RECOMMENDATION:
Screening mammogram in one year. (Code:51-O-LD2)

BI-RADS CATEGORY  1: Negative.

## 2022-06-30 ENCOUNTER — Emergency Department: Payer: No Typology Code available for payment source

## 2022-06-30 ENCOUNTER — Other Ambulatory Visit: Payer: Self-pay

## 2022-06-30 ENCOUNTER — Emergency Department
Admission: EM | Admit: 2022-06-30 | Discharge: 2022-06-30 | Disposition: A | Discharge status: Home / Self Care | Payer: No Typology Code available for payment source | Attending: Emergency Medicine | Admitting: Emergency Medicine

## 2022-06-30 DIAGNOSIS — I10 Essential (primary) hypertension: Secondary | ICD-10-CM | POA: Insufficient documentation

## 2022-06-30 DIAGNOSIS — J45909 Unspecified asthma, uncomplicated: Secondary | ICD-10-CM | POA: Diagnosis not present

## 2022-06-30 DIAGNOSIS — M79652 Pain in left thigh: Secondary | ICD-10-CM | POA: Diagnosis present

## 2022-06-30 DIAGNOSIS — M79605 Pain in left leg: Secondary | ICD-10-CM

## 2022-06-30 MED ORDER — NAPROXEN 500 MG PO TABS: 500.0000 mg | ORAL_TABLET | Freq: Two times a day (BID) | ORAL | 0 refills | Status: DC

## 2022-06-30 NOTE — Discharge Instructions (Signed)
Follow-up with your primary care provider if any continued problems or concerns.  Ice or heat to your muscles as needed for discomfort.  A prescription for naproxen was sent to the pharmacy to begin taking twice a day with food.

## 2022-06-30 NOTE — ED Triage Notes (Signed)
Pt comes with c/o right side leg pain that shoots down. Pt states she may have pulled muscle. Pt states it started week ago.

## 2022-06-30 NOTE — ED Notes (Signed)
Pt discharge to home. Pt VSS, GCS 15, NAD. Pt verbalized understanding of discharge instructions with no additional questions at this time.  

## 2022-06-30 NOTE — ED Provider Notes (Signed)
[facility] Provider Note    Event Date/Time   First MD Initiated Contact with Patient 06/30/22 [phone]     (approximate)   History   Leg Pain   HPI  Gloria Fuller is a 62 y.o. female   presents to the ED with complaint of left sided leg pain that shoots down on the lateral aspect of her thigh.  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  She states it is possible that she could have pulled a muscle.  This started approximately 1 week ago and has not improved.  She states that there is some stiffness until she is up walking and gradually has some improvement.  Patient has a history of elevated blood pressure readings, arthritis, and asthma.      Physical Exam   Triage Vital Signs: ED Triage Vitals [06/30/22 0835]  Enc Vitals Group     BP (!) 162/102     Pulse Rate 66     Resp 18     Temp 98 F (36.7 C)     Temp src      SpO2 96 %     Weight      Height      Head Circumference      Peak Flow      Pain Score 7     Pain Loc      Pain Edu?      Excl. in [place]?     Most recent vital signs: Vitals:   06/30/22 1051 06/30/22 1100  BP: (!) 168/92   Pulse:  70  Resp:  16  Temp:    SpO2:       General: Awake, no distress.  CV:  Good peripheral perfusion.  Resp:  Normal effort.  Abd:  No distention.  Other:  Left thigh without discoloration, edema or warmth in the area.  There is some tenderness on palpation of the lateral aspect of the thigh.  The patient is able to stand and ambulate without any assistance.  No edema or swelling noted to the lower extremity and compared with the right lower extremity.   ED Results / Procedures / Treatments   Labs (all labs ordered are listed, but only abnormal results are displayed) Labs Reviewed - No data to display    RADIOLOGY  Right femur x-ray images reviewed and interpreted by myself independent of the radiologist and was negative for any acute bony  abnormality.   PROCEDURES:  Critical Care performed:   Procedures   MEDICATIONS ORDERED IN ED: Medications - No data to display   IMPRESSION / MDM / [place] / ED COURSE  I reviewed the triage vital signs and the nursing notes.   Differential diagnosis includes, but is not limited to, left lower extremity pain, strain, contusion, degenerative joint disease.  62 year old female presents to the ED with complaint of left lower thigh pain for greater than 1 week without history of injury.  Patient is tender on palpation to the lateral aspect of her leg but no edema or discoloration is noted.  Patient states that it improves with walking.  She has not taken any over-the-counter medications at this time.  X-rays were reassuring and patient was made aware.  There was some minimal degenerative changes noted.  Patient was placed on a prescription for naproxen 500 mg twice daily with food and she is to follow-up with her PCP if any continued problems  or concerns.      Patient's presentation is most consistent with acute complicated illness / injury requiring diagnostic workup.  FINAL CLINICAL IMPRESSION(S) / ED DIAGNOSES   Final diagnoses:  Left leg pain     Rx / DC Orders   ED Discharge Orders          Ordered    naproxen (NAPROSYN) 500 MG tablet  2 times daily with meals        06/30/22 1137             Note:  This document was prepared using Dragon voice recognition software and may include unintentional dictation errors.   Johnn Hai, PA-C 06/30/22 1210    Delman Kitten, MD 06/30/22 [phone]

## 2022-09-13 NOTE — Patient Instructions (Incomplete)
Preventive Care 39-62 Years Old, Female Preventive care refers to lifestyle choices and visits with your health care provider that can promote health and wellness. Preventive care visits are also called wellness exams. What can I expect for my preventive care visit? Counseling Your health care provider may ask you questions about your: Medical history, including: Past medical problems. Family medical history. Pregnancy history. Current health, including: Menstrual cycle. Method of birth control. Emotional well-being. Home life and relationship well-being. Sexual activity and sexual health. Lifestyle, including: Alcohol, nicotine or tobacco, and drug use. Access to firearms. Diet, exercise, and sleep habits. Work and work Astronomer. Sunscreen use. Safety issues such as seatbelt and bike helmet use. Physical exam Your health care provider will check your: Height and weight. These may be used to calculate your BMI (body mass index). BMI is a measurement that tells if you are at a healthy weight. Waist circumference. This measures the distance around your waistline. This measurement also tells if you are at a healthy weight and may help predict your risk of certain diseases, such as type 2 diabetes and high blood pressure. Heart rate and blood pressure. Body temperature. Skin for abnormal spots. What immunizations do I need?  Vaccines are usually given at various ages, according to a schedule. Your health care provider will recommend vaccines for you based on your age, medical history, and lifestyle or other factors, such as travel or where you work. What tests do I need? Screening Your health care provider may recommend screening tests for certain conditions. This may include: Lipid and cholesterol levels. Diabetes screening. This is done by checking your blood sugar (glucose) after you have not eaten for a while (fasting). Pelvic exam and Pap test. Hepatitis B test. Hepatitis C  test. HIV (human immunodeficiency virus) test. STI (sexually transmitted infection) testing, if you are at risk. Lung cancer screening. Colorectal cancer screening. Mammogram. Talk with your health care provider about when you should start having regular mammograms. This may depend on whether you have a family history of breast cancer. BRCA-related cancer screening. This may be done if you have a family history of breast, ovarian, tubal, or peritoneal cancers. Bone density scan. This is done to screen for osteoporosis. Talk with your health care provider about your test results, treatment options, and if necessary, the need for more tests. Follow these instructions at home: Eating and drinking  Eat a diet that includes fresh fruits and vegetables, whole grains, lean protein, and low-fat dairy products. Take vitamin and mineral supplements as recommended by your health care provider. Do not drink alcohol if: Your health care provider tells you not to drink. You are pregnant, may be pregnant, or are planning to become pregnant. If you drink alcohol: Limit how much you have to 0-1 drink a day. Know how much alcohol is in your drink. In the [place] one drink equals one 12 oz bottle of beer (355 mL), one 5 oz glass of wine (148 mL), or one 1 oz glass of hard liquor (44 mL). Lifestyle Brush your teeth every morning and night with fluoride toothpaste. Floss one time each day. Exercise for at least 30 minutes 5 or more days each week. Do not use any products that contain nicotine or tobacco. These products include cigarettes, chewing tobacco, and vaping devices, such as e-cigarettes. If you need help quitting, ask your health care provider. Do not use drugs. If you are sexually active, practice safe sex. Use a condom or other form of protection to  prevent STIs. If you do not wish to become pregnant, use a form of birth control. If you plan to become pregnant, see your health care provider for a  prepregnancy visit. Take aspirin only as told by your health care provider. Make sure that you understand how much to take and what form to take. Work with your health care provider to find out whether it is safe and beneficial for you to take aspirin daily. Find healthy ways to manage stress, such as: Meditation, yoga, or listening to music. Journaling. Talking to a trusted person. Spending time with friends and family. Minimize exposure to UV radiation to reduce your risk of skin cancer. Safety Always wear your seat belt while driving or riding in a vehicle. Do not drive: If you have been drinking alcohol. Do not ride with someone who has been drinking. When you are tired or distracted. While texting. If you have been using any mind-altering substances or drugs. Wear a helmet and other protective equipment during sports activities. If you have firearms in your house, make sure you follow all gun safety procedures. Seek help if you have been physically or sexually abused. What's next? Visit your health care provider once a year for an annual wellness visit. Ask your health care provider how often you should have your eyes and teeth checked. Stay up to date on all vaccines. This information is not intended to replace advice given to you by your health care provider. Make sure you discuss any questions you have with your health care provider. Document Revised: 09/27/2020 Document Reviewed: 09/27/2020 Elsevier Patient Education  2024 [employer].

## 2022-09-16 ENCOUNTER — Encounter: Payer: Self-pay | Admitting: Family Medicine

## 2022-09-16 ENCOUNTER — Ambulatory Visit (INDEPENDENT_AMBULATORY_CARE_PROVIDER_SITE_OTHER): Payer: No Typology Code available for payment source | Admitting: Family Medicine

## 2022-09-16 ENCOUNTER — Other Ambulatory Visit (HOSPITAL_COMMUNITY)
Admission: RE | Admit: 2022-09-16 | Discharge: 2022-09-16 | Disposition: A | Discharge status: Home / Self Care | Payer: PRIVATE HEALTH INSURANCE | Source: Ambulatory Visit | Attending: Family Medicine | Admitting: Family Medicine

## 2022-09-16 VITALS — BP 130/86 | HR 91 | Temp 98.3°F | Resp 16 | Ht 66.0 in | Wt 201.1 lb

## 2022-09-16 DIAGNOSIS — R87810 Cervical high risk human papillomavirus (HPV) DNA test positive: Secondary | ICD-10-CM | POA: Diagnosis not present

## 2022-09-16 DIAGNOSIS — Z0000 Encounter for general adult medical examination without abnormal findings: Secondary | ICD-10-CM | POA: Diagnosis not present

## 2022-09-16 DIAGNOSIS — Z1231 Encounter for screening mammogram for malignant neoplasm of breast: Secondary | ICD-10-CM

## 2022-09-16 DIAGNOSIS — N870 Mild cervical dysplasia: Secondary | ICD-10-CM | POA: Insufficient documentation

## 2022-09-16 DIAGNOSIS — Z124 Encounter for screening for malignant neoplasm of cervix: Secondary | ICD-10-CM | POA: Diagnosis present

## 2022-09-16 NOTE — Progress Notes (Signed)
Patient: Gloria Fuller, Female    DOB: October 19, 1960, 62 y.o.   MRN: [id number] Danelle Berry, PA-C Visit Date: 09/16/2022  Today's Provider: Danelle Berry, PA-C   Chief Complaint  Patient presents with   Annual Exam   Subjective:   Annual physical exam:  Gloria Fuller is a 62 y.o. female who presents today for complete physical exam:  Exercise/Activity Diet/nutrition:   started up again with diet/lifestyle walking more drinking more water, eats healthy Sleep:  no concerns  SDOH Screenings   Food Insecurity: No Food Insecurity (09/16/2022)  Housing: Low Risk  (09/16/2022)  Transportation Needs: No Transportation Needs (09/16/2022)  Utilities: Not At Risk (09/16/2022)  Alcohol Screen: Low Risk  (09/16/2022)  Depression (PHQ2-9): Low Risk  (09/16/2022)  Financial Resource Strain: Low Risk  (09/16/2022)  Physical Activity: Sufficiently Active (09/16/2022)  Social Connections: Moderately Integrated (09/16/2022)  Stress: No Stress Concern Present (09/16/2022)  Tobacco Use: Low Risk  (09/16/2022)   Colposcopy done June 2023 with GYN PAP - LOW-GRADE SQUAMOUS  INTRAEPITHELIAL LESION (CIN 1)  RECOMMENDATION 1-year follow-up HPV-based screening at follow-up visit  Cannot see GYN recommendations through mychart after pathology results  Result date Tests and Procedures  09/12/2021  Cytology - PAP  HIGH RISK HPV ([place]): Positive Important  NEISSERIA GONORRHEA ([place]): Negative CHLAMYDIA ([place]): Negative TRICHOMONAS ([place]): Negative YPV 16 ([place]): Negative HPV 18 / 45 ([place]): Positive Important  ADEQUACY: Satisfactory for evaluation; transformation zone component PRESENT. DIAGNOSIS: - Atypical squamous cells of undetermined significance (ASC-US) Important  COMMENT (MOLECULAR): Normal Reference [place] Chlamydia - Negative COMMENT (MOLECULAR): Normal Reference Range Neisseria Gonorrhea - Negative COMMENT (MOLECULAR): Normal Reference Range HPV - Negative COMMENT  (MOLECULAR): Normal Reference Range Trichomonas - Negative COMMENT (MOLECULAR): Normal Reference Range HPV 16- Negative COMMENT (MOLECULAR): Normal Reference Range HPV 16 18 45 -Negative     USPSTF grade A and B recommendations - reviewed and addressed today  Depression:  Phq 9 completed today by patient, was reviewed by me with patient in the room PHQ score is neg, pt feels good    09/16/2022    3:21 PM 09/12/2021    3:09 PM 06/25/2021    1:42 PM 06/29/2020    2:27 PM  PHQ 2/9 Scores  PHQ - 2 Score 0 0 0 0  PHQ- 9 Score 0  0 0      09/16/2022    3:21 PM 09/12/2021    3:09 PM 06/25/2021    1:42 PM 06/29/2020    2:27 PM 05/17/2019    8:18 AM  Depression screen PHQ 2/9  Decreased Interest 0 0 0 0 0  Down, Depressed, Hopeless 0 0 0 0 0  PHQ - 2 Score 0 0 0 0 0  Altered sleeping 0  0 0 0  Tired, decreased energy 0  0 0 0  Change in appetite 0  0 0 1  Feeling bad or failure about yourself  0  0 0 0  Trouble concentrating 0  0 0 0  Moving slowly or fidgety/restless 0  0 0 0  Suicidal thoughts 0  0 0 0  PHQ-9 Score 0  0 0 1  Difficult doing work/chores Not difficult at all  Not difficult at all  Not difficult at all    Alcohol screening: Flowsheet Row Office Visit from 09/16/2022 in [facility]  AUDIT-C Score 0       Immunizations and Health Maintenance: Health Maintenance  Topic Date Due   MAMMOGRAM  12/25/2021   PAP SMEAR-Modifier  09/13/2022   COVID-19 Vaccine (3 - 2023-24 season) 10/02/2022 (Originally 12/14/2021)   Zoster Vaccines- Shingrix (1 of 2) 12/17/2022 (Originally 09/29/2010)   INFLUENZA VACCINE  11/14/2022   Fecal DNA (Cologuard)  11/07/2024   DTaP/Tdap/Td (2 - Td or Tdap) 05/16/2029   Hepatitis C Screening  Completed   HIV Screening  Completed   HPV VACCINES  Aged Out   Colonoscopy  Discontinued     Hep C Screening: done  STD testing and prevention (HIV/chl/gon/syphilis):  see above, no additional testing desired by pt  today  Intimate partner violence: safe   Sexual History/Pain during Intercourse: Divorced, seeing someone, no concerns with sex no exposure to anythings requiring screening  Menstrual History/LMP/Abnormal Bleeding: no AUB Patient's last menstrual period was 09/03/2015.  Incontinence Symptoms: none  Breast cancer: due Last Mammogram: *see HM list above   Cervical cancer screening: due today - repeat PAP/HPV   Osteoporosis:   Discussion on osteoporosis per age, including high calcium and vitamin D supplementation, weight bearing exercises Not doing supplements, walking daily   Skin cancer:  Hx of skin CA -  NO Discussed atypical lesions   Colorectal cancer:   Colonoscopy is UTD no concerning sx or changes in bowels    Lung cancer:   Low Dose CT Chest recommended if Age 62-80 years, 20 pack-year currently smoking OR have quit w/in 15years. Patient does not qualify.    Social History   Tobacco Use   Smoking status: Never   Smokeless tobacco: Never   Tobacco comments:    hx of past 2nd hand smoke exposure with exhusband (7)   Vaping Use   Vaping Use: Never used  Substance Use Topics   Alcohol use: No   Drug use: No     Flowsheet Row Office Visit from 09/16/2022 in [facility]  AUDIT-C Score 0       Family History  Problem Relation Age of Onset   Scleroderma Mother    Aneurysm Father    Cerebral aneurysm Father    Hypertension Maternal Grandmother    Breast cancer Maternal Aunt        Great Aunt   Breast cancer Cousin        maternal     Blood pressure/Hypertension: BP Readings from Last 3 Encounters:  09/16/22 130/86  06/30/22 (!) 168/92  09/12/21 130/82    Weight/Obesity: Wt Readings from Last 3 Encounters:  09/16/22 201 lb 1.6 oz (91.2 kg)  09/12/21 202 lb (91.6 kg)  06/25/21 202 lb 8 oz (91.9 kg)   BMI Readings from Last 3 Encounters:  09/16/22 32.46 kg/m  09/12/21 32.60 kg/m  06/25/21 32.19 kg/m     Lipids:   Lab Results  Component Value Date   CHOL 111 09/12/2021   CHOL 109 06/29/2020   CHOL 107 05/17/2019   Lab Results  Component Value Date   HDL 54 09/12/2021   HDL 57 06/29/2020   HDL 53 05/17/2019   Lab Results  Component Value Date   LDLCALC 42 09/12/2021   LDLCALC 38 06/29/2020   LDLCALC 40 05/17/2019   Lab Results  Component Value Date   TRIG 71 09/12/2021   TRIG 65 06/29/2020   TRIG 56 05/17/2019   Lab Results  Component Value Date   CHOLHDL 2.1 09/12/2021   CHOLHDL 1.9 06/29/2020   CHOLHDL 2.0 05/17/2019   No results found for: "LDLDIRECT" Based on  the results of lipid panel his/her cardiovascular risk factor ( using [facility] )  in the next 10 years is: The ASCVD Risk score (Arnett DK, et al., 2019) failed to calculate for the following reasons:   The valid total cholesterol range is 130 to 320 mg/dL  Glucose:  Glucose  Date Value Ref Range Status  07/15/2011 75 65 - 99 mg/dL Final   Glucose, Bld  Date Value Ref Range Status  09/12/2021 84 65 - 99 mg/dL Final    Comment:    .            Fasting reference interval .   06/29/2020 80 65 - 99 mg/dL Final    Comment:    .            Fasting reference interval .   05/17/2019 85 65 - 99 mg/dL Final    Comment:    .            Fasting reference interval .     Advanced Care Planning:  A voluntary discussion about advance care planning including the explanation and discussion of advance directives.   Discussed health care proxy and Living will, and the patient was able to identify a health care proxy as son travis Kalter.   Patient does not have a living will at present time.   Social History       Social History   Socioeconomic History   Marital status: Divorced    Spouse name: Not on file   Number of children: Not on file   Years of education: Not on file   Highest education level: Not on file  Occupational History   Not on file  Tobacco Use   Smoking status: Never   Smokeless tobacco:  Never   Tobacco comments:    hx of past 2nd hand smoke exposure with exhusband (7)   Vaping Use   Vaping Use: Never used  Substance and Sexual Activity   Alcohol use: No   Drug use: No   Sexual activity: Yes    Partners: Male    Birth control/protection: Post-menopausal  Other Topics Concern   Not on file  Social History Narrative          Social Determinants of Health   Financial Resource Strain: Low Risk  (09/16/2022)   Overall Financial Resource Strain (CARDIA)    Difficulty of Paying Living Expenses: Not hard at all  Food Insecurity: No Food Insecurity (09/16/2022)   Hunger Vital Sign    Worried About Running Out of Food in the Last Year: Never true    Ran Out of Food in the Last Year: Never true  Transportation Needs: No Transportation Needs (09/16/2022)   PRAPARE - Administrator, Civil Service (Medical): No    Lack of Transportation (Non-Medical): No  Physical Activity: Sufficiently Active (09/16/2022)   Exercise Vital Sign    Days of Exercise per Week: 5 days    Minutes of Exercise per Session: 30 min  Stress: No Stress Concern Present (09/16/2022)   [employer] of Occupational Health - Occupational Stress Questionnaire    Feeling of Stress : Not at all  Social Connections: Moderately Integrated (09/16/2022)   Social Connection and Isolation Panel [NHANES]    Frequency of Communication with Friends and Family: More than three times a week    Frequency of Social Gatherings with Friends and Family: More than three times a week    Attends Religious Services: More than  4 times per year    Active Member of Clubs or Organizations: Yes    Attends Engineer, structural: More than 4 times per year    Marital Status: Divorced    Family History        Family History  Problem Relation Age of Onset   Scleroderma Mother    Aneurysm Father    Cerebral aneurysm Father    Hypertension Maternal Grandmother    Breast cancer Maternal Aunt        Great Aunt    Breast cancer Cousin        maternal    Patient Active Problem List   Diagnosis Date Noted   Cervical high risk HPV (human papillomavirus) test positive 09/12/2021   Vitamin D deficiency 05/14/2017   Shifting sleep-work schedule 05/13/2017   Obesity 03/31/2015    History reviewed. No pertinent surgical history.   Current Outpatient Medications:    naproxen (NAPROSYN) 500 MG tablet, Take 1 tablet (500 mg total) by mouth 2 (two) times daily with a meal. (Patient not taking: Reported on 09/16/2022), Disp: 20 tablet, Rfl: 0  No Known Allergies  Patient Care Team: Danelle Berry, PA-C as PCP - General (Family Medicine) Dennison Mascot, MD (Family Medicine)   Chart Review: I personally reviewed active problem list, medication list, allergies, family history, social history, health maintenance, notes from last encounter, lab results, imaging with the patient/caregiver today.   Review of Systems  Constitutional: Negative.   HENT: Negative.    Eyes: Negative.   Respiratory: Negative.    Cardiovascular: Negative.   Gastrointestinal: Negative.   Endocrine: Negative.   Genitourinary: Negative.   Musculoskeletal: Negative.   Skin: Negative.   Allergic/Immunologic: Negative.   Neurological: Negative.   Hematological: Negative.   Psychiatric/Behavioral: Negative.    All other systems reviewed and are negative.         Objective:   Vitals:  Vitals:   09/16/22 1526  BP: 130/86  Pulse: 91  Resp: 16  Temp: 98.3 F (36.8 C)  TempSrc: Oral  SpO2: 98%  Weight: 201 lb 1.6 oz (91.2 kg)  Height: 5\' 6"  (1.676 m)    Body mass index is 32.46 kg/m.  Physical Exam Vitals and nursing note reviewed. Exam conducted with a chaperone present.  Constitutional:      General: She is not in acute distress.    Appearance: Normal appearance. She is well-developed and overweight. She is not ill-appearing, toxic-appearing or diaphoretic.     Interventions: Face mask in place.  HENT:      Head: Normocephalic and atraumatic.     Right Ear: Tympanic membrane, ear canal and external ear normal. There is no impacted cerumen.     Left Ear: Tympanic membrane, ear canal and external ear normal. There is no impacted cerumen.     Nose: Mucosal edema and congestion present.     Right Turbinates: Enlarged and swollen.     Left Turbinates: Enlarged and swollen.     Mouth/Throat:     Mouth: Mucous membranes are moist.     Pharynx: Oropharynx is clear. No oropharyngeal exudate or posterior oropharyngeal erythema.  Eyes:     General: Lids are normal. No scleral icterus.       Right eye: No discharge.        Left eye: No discharge.     Conjunctiva/sclera: Conjunctivae normal.  Neck:     Trachea: Phonation normal. No tracheal deviation.  Cardiovascular:     Rate and  Rhythm: Normal rate and regular rhythm.     Pulses: Normal pulses.          Radial pulses are 2+ on the right side and 2+ on the left side.       Posterior tibial pulses are 2+ on the right side and 2+ on the left side.     Heart sounds: Normal heart sounds. No murmur heard.    No friction rub. No gallop.  Pulmonary:     Effort: Pulmonary effort is normal. No respiratory distress.     Breath sounds: Normal breath sounds. No stridor. No wheezing, rhonchi or rales.  Chest:     Chest wall: No tenderness.  Abdominal:     General: Bowel sounds are normal. There is no distension.     Palpations: Abdomen is soft.     Tenderness: There is no abdominal tenderness. There is no right CVA tenderness, left CVA tenderness, guarding or rebound.  Genitourinary:    General: Normal vulva.     Vagina: No signs of injury. Prolapsed vaginal walls (weak vaginal walls collapsed) present. No vaginal discharge, erythema or tenderness.     Cervix: Erythema and eversion present. No cervical motion tenderness, discharge, friability, lesion or cervical bleeding.     Uterus: Normal.      Adnexa: Right adnexa normal and left adnexa normal.        Right: No mass, tenderness or fullness.         Left: No mass, tenderness or fullness.    Musculoskeletal:     Right lower leg: No edema.     Left lower leg: No edema.  Skin:    General: Skin is warm and dry.     Coloration: Skin is not jaundiced or pale.     Findings: No rash.  Neurological:     Mental Status: She is alert. Mental status is at baseline.     Motor: No abnormal muscle tone.     Gait: Gait normal.  Psychiatric:        Mood and Affect: Mood normal.        Speech: Speech normal.        Behavior: Behavior normal. Behavior is cooperative.       Fall Risk:    09/16/2022    3:21 PM 09/12/2021    3:09 PM 06/25/2021    1:42 PM 06/29/2020    2:27 PM 05/17/2019    8:18 AM  Fall Risk   Falls in the past year? 0 0 0 0 0  Number falls in past yr: 0 0 0 0   Injury with Fall? 0 0 0 0   Risk for fall due to : No Fall Risks No Fall Risks No Fall Risks    Follow up Falls prevention discussed;Education provided;Falls evaluation completed Falls prevention discussed Falls prevention discussed      Functional Status Survey: Is the patient deaf or have difficulty hearing?: No Does the patient have difficulty seeing, even when wearing glasses/contacts?: No Does the patient have difficulty concentrating, remembering, or making decisions?: No Does the patient have difficulty walking or climbing stairs?: No Does the patient have difficulty dressing or bathing?: No Does the patient have difficulty doing errands alone such as visiting a doctor's office or shopping?: No   Assessment & Plan:    CPE completed today  USPSTF grade A and B recommendations reviewed with patient; age-appropriate recommendations, preventive care, screening tests, etc discussed and encouraged; healthy living encouraged; see AVS for patient  education given to patient  Discussed importance of 150 minutes of physical activity weekly, AHA exercise recommendations given to pt in AVS/handout  Discussed importance  of healthy diet:  eating lean meats and proteins, avoiding trans fats and saturated fats, avoid simple sugars and excessive carbs in diet, eat 6 servings of fruit/vegetables daily and drink plenty of water and avoid sweet beverages.    Recommended pt to do annual eye exam and routine dental exams/cleanings  Depression, alcohol, fall screening completed as documented above and per flowsheets  Advance Care planning information and packet discussed and offered today, encouraged pt to discuss with family members/spouse/partner/friends and complete Advanced directive packet and bring copy to office   Reviewed Health Maintenance: Health Maintenance  Topic Date Due   MAMMOGRAM  12/25/2021   PAP SMEAR-Modifier  09/13/2022   COVID-19 Vaccine (3 - 2023-24 season) 10/02/2022 (Originally 12/14/2021)   Zoster Vaccines- Shingrix (1 of 2) 12/17/2022 (Originally 09/29/2010)   INFLUENZA VACCINE  11/14/2022   Fecal DNA (Cologuard)  11/07/2024   DTaP/Tdap/Td (2 - Td or Tdap) 05/16/2029   Hepatitis C Screening  Completed   HIV Screening  Completed   HPV VACCINES  Aged Out   Colonoscopy  Discontinued    Immunizations: Immunization History  Administered Date(s) Administered   [employer] Vaccination 09/05/2019, 10/03/2019   Tdap 05/17/2019   Vaccines:  HPV: up to at age 66 , ask insurance if age between 2-45  Shingrix: DUE - 17-64 yo and ask insurance if covered when patient above 56 yo Pneumonia: not due educated and discussed with patient. Flu: out of season educated and discussed with patient. COVID:      ICD-10-CM   1. Annual physical exam  Z00.00 COMPLETE METABOLIC PANEL WITH GFR    CBC with Differential/Platelet    2. Encounter for screening mammogram for malignant neoplasm of breast  Z12.31 MM 3D SCREENING MAMMOGRAM BILATERAL BREAST    3. Cervical high risk HPV (human papillomavirus) test positive  R87.810 Cytology - PAP    4. CIN I (cervical intraepithelial neoplasia I)  N87.0  Cytology - PAP    5. Screening for malignant neoplasm of cervix  Z12.4 Cytology - PAP          Danelle Berry, PA-C 09/16/22 3:57 PM  [facility]

## 2022-09-20 LAB — CYTOLOGY - PAP
Comment: NEGATIVE
Comment: NEGATIVE
Comment: NEGATIVE
Diagnosis: UNDETERMINED — AB
HPV 16: NEGATIVE
HPV 18 / 45: POSITIVE — AB
High risk HPV: POSITIVE — AB

## 2022-09-24 ENCOUNTER — Telehealth: Payer: Self-pay | Admitting: Family Medicine

## 2022-09-24 NOTE — Telephone Encounter (Unsigned)
Copied from CRM [id number]. Topic: Appointment Scheduling - Scheduling Inquiry for Clinic >> Sep 24, 2022 12:03 PM Marlow Baars wrote: Reason for CRM: The patient called in stating she would like an order for a mammogram to be sent in to [facility] as this is where she had it done last year. Please assist patient further

## 2022-09-25 NOTE — Telephone Encounter (Signed)
Order already put in

## 2022-09-26 ENCOUNTER — Telehealth: Payer: Self-pay | Admitting: Family Medicine

## 2022-09-26 NOTE — Telephone Encounter (Signed)
Discussed PAP/HPV results with GYN Dr. Jean Rosenthal who did confirm pt needs to f/up with him for colpo Pt sent msg through [facility]

## 2022-09-28 LAB — COMPLETE METABOLIC PANEL WITH GFR
AG Ratio: 1.2 (calc) (ref 1.0–2.5)
ALT: 12 U/L (ref 6–29)
AST: 14 U/L (ref 10–35)
Albumin: 4.2 g/dL (ref 3.6–5.1)
Alkaline phosphatase (APISO): 69 U/L (ref 37–153)
BUN: 13 mg/dL (ref 7–25)
CO2: 30 mmol/L (ref 20–32)
Calcium: 10.5 mg/dL — ABNORMAL HIGH (ref 8.6–10.4)
Chloride: 106 mmol/L (ref 98–110)
Creat: 0.9 mg/dL (ref 0.50–1.05)
Globulin: 3.5 g/dL (calc) (ref 1.9–3.7)
Glucose, Bld: 96 mg/dL (ref 65–99)
Potassium: 4.4 mmol/L (ref 3.5–5.3)
Sodium: 142 mmol/L (ref 135–146)
Total Bilirubin: 0.3 mg/dL (ref 0.2–1.2)
Total Protein: 7.7 g/dL (ref 6.1–8.1)
eGFR: 73 mL/min/{1.73_m2} (ref >=60)

## 2022-09-28 LAB — CBC WITH DIFFERENTIAL/PLATELET
Absolute Monocytes: 476 cells/uL (ref 200–950)
Basophils Absolute: 39 cells/uL (ref 0–200)
Basophils Relative: 0.7 %
Eosinophils Absolute: 78 cells/uL (ref 15–500)
Eosinophils Relative: 1.4 %
HCT: 40.1 % (ref 35.0–45.0)
Hemoglobin: 13.1 g/dL (ref 11.7–15.5)
Lymphs Abs: 1786 cells/uL (ref 850–3900)
MCH: 29.2 pg (ref 27.0–33.0)
MCHC: 32.7 g/dL (ref 32.0–36.0)
MCV: 89.3 fL (ref 80.0–100.0)
MPV: 10 fL (ref 7.5–12.5)
Monocytes Relative: 8.5 %
Neutro Abs: 3220 cells/uL (ref 1500–7800)
Neutrophils Relative %: 57.5 %
Platelets: 268 10*3/uL (ref 140–400)
RBC: 4.49 10*6/uL (ref 3.80–5.10)
RDW: 12.6 % (ref 11.0–15.0)
Total Lymphocyte: 31.9 %
WBC: 5.6 10*3/uL (ref 3.8–10.8)

## 2022-10-02 ENCOUNTER — Ambulatory Visit
Admission: RE | Admit: 2022-10-02 | Discharge: 2022-10-02 | Disposition: A | Discharge status: Home / Self Care | Payer: No Typology Code available for payment source | Source: Ambulatory Visit | Attending: Family Medicine | Admitting: Family Medicine

## 2022-10-02 DIAGNOSIS — Z1231 Encounter for screening mammogram for malignant neoplasm of breast: Secondary | ICD-10-CM | POA: Insufficient documentation

## 2023-09-17 ENCOUNTER — Encounter: Payer: Self-pay | Admitting: Family Medicine

## 2023-09-17 ENCOUNTER — Ambulatory Visit (INDEPENDENT_AMBULATORY_CARE_PROVIDER_SITE_OTHER): Payer: PRIVATE HEALTH INSURANCE | Admitting: Family Medicine

## 2023-09-17 VITALS — BP 132/82 | HR 77 | Resp 16 | Ht 66.0 in | Wt 201.0 lb

## 2023-09-17 DIAGNOSIS — Z1231 Encounter for screening mammogram for malignant neoplasm of breast: Secondary | ICD-10-CM

## 2023-09-17 DIAGNOSIS — R0981 Nasal congestion: Secondary | ICD-10-CM | POA: Diagnosis not present

## 2023-09-17 DIAGNOSIS — Z0000 Encounter for general adult medical examination without abnormal findings: Secondary | ICD-10-CM

## 2023-09-17 DIAGNOSIS — R87810 Cervical high risk human papillomavirus (HPV) DNA test positive: Secondary | ICD-10-CM

## 2023-09-17 DIAGNOSIS — Z124 Encounter for screening for malignant neoplasm of cervix: Secondary | ICD-10-CM

## 2023-09-17 MED ORDER — FLUTICASONE PROPIONATE 50 MCG/ACT NA SUSP
2.0000 | Freq: Every day | NASAL | 6 refills | Status: AC
Start: 2023-09-17 — End: ?

## 2023-09-17 NOTE — Progress Notes (Signed)
 Patient: Gloria Fuller, Female    DOB: 1960-11-14, 63 y.o.   MRN: [id number] Adeline Hone, PA-C Visit Date: 09/17/2023  Today's Provider: Adeline Hone, PA-C   Chief Complaint  Patient presents with   Annual Exam   Subjective:   Annual physical exam:  Gloria Fuller is a 63 y.o. female who presents today for complete physical exam:  Exercise/Activity/ Diet/nutrition:  started up again with diet/lifestyle walking more drinking more water, eats healthy  Sleep:  no concerns   SDOH Screenings   Food Insecurity: No Food Insecurity (09/17/2023)  Housing: Unknown (09/17/2023)  Transportation Needs: No Transportation Needs (09/17/2023)  Utilities: Not At Risk (09/17/2023)  Alcohol Screen: Low Risk  (09/17/2023)  Depression (PHQ2-9): Low Risk  (09/17/2023)  Financial Resource Strain: Low Risk  (09/17/2023)  Physical Activity: Sufficiently Active (09/17/2023)  Social Connections: Moderately Integrated (09/17/2023)  Stress: No Stress Concern Present (09/17/2023)  Tobacco Use: Low Risk  (09/17/2023)  Health Literacy: Adequate Health Literacy (09/17/2023)    USPSTF grade A and B recommendations - reviewed and addressed today  Depression:  Phq 9 completed today by patient, was reviewed by me with patient in the room PHQ score is neg, pt feels good, no concerns with mood    09/17/2023    3:44 PM 09/16/2022    3:21 PM 09/12/2021    3:09 PM 06/25/2021    1:42 PM  PHQ 2/9 Scores  PHQ - 2 Score 0 0 0 0  PHQ- 9 Score  0  0      09/17/2023    3:44 PM 09/16/2022    3:21 PM 09/12/2021    3:09 PM 06/25/2021    1:42 PM 06/29/2020    2:27 PM  Depression screen PHQ 2/9  Decreased Interest 0 0 0 0 0  Down, Depressed, Hopeless 0 0 0 0 0  PHQ - 2 Score 0 0 0 0 0  Altered sleeping  0  0 0  Tired, decreased energy  0  0 0  Change in appetite  0  0 0  Feeling bad or failure about yourself   0  0 0  Trouble concentrating  0  0 0  Moving slowly or fidgety/restless  0  0 0  Suicidal thoughts  0  0 0  PHQ-9 Score  0  0 0   Difficult doing work/chores  Not difficult at all  Not difficult at all     Alcohol screening: Flowsheet Row Office Visit from 09/17/20[facility] in [facility]  AUDIT-C Score 0       Immunizations and Health Maintenance: Health Maintenance  Topic Date Due   Cervical Cancer Screening (Pap smear)  09/16/2023   COVID-19 Vaccine (3 - 2024-25 season) 10/02/2023 (Originally 12/15/2022)   Zoster Vaccines- Shingrix (1 of 2) 12/17/2023 (Originally 09/29/2010)   MAMMOGRAM  10/02/2023   INFLUENZA VACCINE  11/14/2023   Fecal DNA (Cologuard)  11/07/2024   DTaP/Tdap/Td (2 - Td or Tdap) 05/16/2029   Hepatitis C Screening  Completed   HIV Screening  Completed   HPV VACCINES  Aged Out   Meningococcal B Vaccine  Aged Out     Hep C Screening: done   STD testing and prevention (HIV/chl/gon/syphilis):  see above, no additional testing desired by pt today   Intimate partner violence:feels safe   Sexual History/Pain during Intercourse: Divorced, Sexually active one female partner No sx or concerns  Menstrual History/LMP/Abnormal Bleeding: none, denies Patient's last menstrual period was 09/03/2015.  Incontinence Symptoms: rare urge/stress incontinence  Breast cancer: due this month, does annually  Last Mammogram: *see HM list above BRCA gene screening: none  Cervical cancer screening: abnormal - f/up with GYN   Osteoporosis:   Discussion on osteoporosis per age, including high calcium and vitamin D  supplementation, weight bearing exercises Pt is not supplementing with daily calcium/Vit D. None yet Bone scan/dexa Roughly experienced menopause in 2017   Skin cancer:  Hx of skin CA -  NO Discussed atypical lesions   Colorectal cancer:   Colonoscopy is UTD - doing cologuard - due next year Discussed concerning signs and sx of CRC, pt denies melena, hematochezia, change in BM pattern or caliber   Lung cancer:   Low Dose CT Chest recommended if Age 42-80 years, 20  pack-year currently smoking OR have quit w/in 15years. Patient does not qualify.    Social History   Tobacco Use   Smoking status: Never   Smokeless tobacco: Never   Tobacco comments:    hx of past 2nd hand smoke exposure with exhusband (7)   Vaping Use   Vaping status: Never Used  Substance Use Topics   Alcohol use: No   Drug use: No     Flowsheet Row Office Visit from 09/17/2023 in [facility]  AUDIT-C Score 0       Family History  Problem Relation Age of Onset   Scleroderma Mother    Aneurysm Father    Cerebral aneurysm Father    Hypertension Maternal Grandmother    Breast cancer Maternal Aunt        Great Aunt   Breast cancer Cousin        maternal     Blood pressure/Hypertension: BP Readings from Last 3 Encounters:  09/17/23 132/82  09/16/22 130/86  06/30/22 (!) 168/92    Weight/Obesity: Wt Readings from Last 3 Encounters:  09/17/23 201 lb (91.2 kg)  09/16/22 201 lb 1.6 oz (91.2 kg)  09/12/21 202 lb (91.6 kg)   BMI Readings from Last 3 Encounters:  09/17/23 32.44 kg/m  09/16/22 32.46 kg/m  09/12/21 32.60 kg/m     Lipids:  Lab Results  Component Value Date   CHOL 111 09/12/2021   CHOL 109 06/29/2020   CHOL 107 05/17/2019   Lab Results  Component Value Date   HDL 54 09/12/2021   HDL 57 06/29/2020   HDL 53 05/17/2019   Lab Results  Component Value Date   LDLCALC 42 09/12/2021   LDLCALC 38 06/29/2020   LDLCALC 40 05/17/2019   Lab Results  Component Value Date   TRIG 71 09/12/2021   TRIG 65 06/29/2020   TRIG 56 05/17/2019   Lab Results  Component Value Date   CHOLHDL 2.1 09/12/2021   CHOLHDL 1.9 06/29/2020   CHOLHDL 2.0 05/17/2019   No results found for: "LDLDIRECT" Based on the results of lipid panel his/her cardiovascular risk factor ( using Poole Cohort )  in the next 10 years is: The ASCVD Risk score (Arnett DK, et al., 2019) failed to calculate for the following reasons:   The valid total  cholesterol range is 130 to 320 mg/dL  Glucose:  Glucose  Date Value Ref Range Status  07/15/2011 75 65 - 99 mg/dL Final   Glucose, Bld  Date Value Ref Range Status  09/27/2022 96 65 - 99 mg/dL Final    Comment:    .            Fasting reference interval .  09/12/2021 84 65 - 99 mg/dL Final    Comment:    .            Fasting reference interval .   06/29/2020 80 65 - 99 mg/dL Final    Comment:    .            Fasting reference interval .      Social History       Social History   Socioeconomic History   Marital status: Divorced    Spouse name: Not on file   Number of children: Not on file   Years of education: Not on file   Highest education level: Not on file  Occupational History   Not on file  Tobacco Use   Smoking status: Never   Smokeless tobacco: Never   Tobacco comments:    hx of past 2nd hand smoke exposure with exhusband (7)   Vaping Use   Vaping status: Never Used  Substance and Sexual Activity   Alcohol use: No   Drug use: No   Sexual activity: Yes    Partners: Male    Birth control/protection: Post-menopausal  Other Topics Concern   Not on file  Social History Narrative          Social Drivers of Health   Financial Resource Strain: Low Risk  (09/17/2023)   Overall Financial Resource Strain (CARDIA)    Difficulty of Paying Living Expenses: Not hard at all  Food Insecurity: No Food Insecurity (09/17/2023)   Hunger Vital Sign    Worried About Running Out of Food in the Last Year: Never true    Ran Out of Food in the Last Year: Never true  Transportation Needs: No Transportation Needs (09/17/2023)   PRAPARE - Administrator, Civil Service (Medical): No    Lack of Transportation (Non-Medical): No  Physical Activity: Sufficiently Active (09/17/2023)   Exercise Vital Sign    Days of Exercise per Week: 5 days    Minutes of Exercise per Session: 30 min  Stress: No Stress Concern Present (09/17/2023)   [employer] of  Occupational Health - Occupational Stress Questionnaire    Feeling of Stress : Not at all  Social Connections: Moderately Integrated (09/17/2023)   Social Connection and Isolation Panel [NHANES]    Frequency of Communication with Friends and Family: More than three times a week    Frequency of Social Gatherings with Friends and Family: More than three times a week    Attends Religious Services: More than 4 times per year    Active Member of Clubs or Organizations: Yes    Attends Engineer, structural: More than 4 times per year    Marital Status: Divorced    Family History        Family History  Problem Relation Age of Onset   Scleroderma Mother    Aneurysm Father    Cerebral aneurysm Father    Hypertension Maternal Grandmother    Breast cancer Maternal Aunt        Great Aunt   Breast cancer Cousin        maternal    Patient Active Problem List   Diagnosis Date Noted   Cervical high risk HPV (human papillomavirus) test positive 09/12/2021   Vitamin D  deficiency 05/14/2017   Shifting sleep-work schedule 05/13/2017   Obesity 03/31/2015    History reviewed. No pertinent surgical history.  No current outpatient medications on file.  No Known Allergies  Patient Care  Team: Unity Luepke, PA-C as PCP - General (Family Medicine) Cinderella Crea, MD (Family Medicine)   Chart Review: I personally reviewed active problem list, medication list, allergies, family history, social history, health maintenance, notes from last encounter, lab results, imaging with the patient/caregiver today.   Review of Systems  Constitutional: Negative.   HENT: Negative.    Eyes: Negative.   Respiratory: Negative.    Cardiovascular: Negative.   Gastrointestinal: Negative.   Endocrine: Negative.   Genitourinary: Negative.   Musculoskeletal: Negative.   Skin: Negative.   Allergic/Immunologic: Negative.   Neurological: Negative.   Hematological: Negative.   Psychiatric/Behavioral:  Negative.    All other systems reviewed and are negative.         Objective:   Vitals:  Vitals:   09/17/23 1544  BP: 132/82  Pulse: 77  Resp: 16  SpO2: 98%  Weight: 201 lb (91.2 kg)  Height: 5\' 6"  (1.676 m)    Body mass index is 32.44 kg/m.  Physical Exam Vitals and nursing note reviewed.  Constitutional:      General: She is not in acute distress.    Appearance: Normal appearance. She is well-developed. She is obese. She is not ill-appearing, toxic-appearing or diaphoretic.  HENT:     Head: Normocephalic and atraumatic.     Right Ear: External ear normal.     Left Ear: External ear normal.     Nose: Congestion and rhinorrhea present.     Right Nostril: Occlusion present.     Left Turbinates: Swollen.     Mouth/Throat:     Mouth: Mucous membranes are moist.     Pharynx: Oropharynx is clear. No oropharyngeal exudate or posterior oropharyngeal erythema.  Eyes:     General: No scleral icterus.       Right eye: No discharge.        Left eye: No discharge.     Conjunctiva/sclera: Conjunctivae normal.  Neck:     Trachea: No tracheal deviation.  Cardiovascular:     Rate and Rhythm: Normal rate and regular rhythm.     Pulses: Normal pulses.     Heart sounds: Normal heart sounds. No murmur heard.    No friction rub. No gallop.  Pulmonary:     Effort: Pulmonary effort is normal. No respiratory distress.     Breath sounds: Normal breath sounds. No stridor. No wheezing, rhonchi or rales.  Abdominal:     General: Bowel sounds are normal. There is no distension.     Palpations: Abdomen is soft.     Tenderness: There is no abdominal tenderness.  Skin:    General: Skin is warm and dry.     Findings: No rash.  Neurological:     Mental Status: She is alert.     Motor: No abnormal muscle tone.     Coordination: Coordination normal.     Gait: Gait normal.  Psychiatric:        Mood and Affect: Mood normal.        Behavior: Behavior normal.       Fall Risk:     09/17/2023    3:43 PM 09/16/2022    3:21 PM 09/12/2021    3:09 PM 06/25/2021    1:42 PM 06/29/2020    2:27 PM  Fall Risk   Falls in the past year? 0 0 0 0 0  Number falls in past yr: 0 0 0 0 0  Injury with Fall? 0 0 0 0 0  Risk for fall  due to : No Fall Risks No Fall Risks No Fall Risks No Fall Risks   Follow up Falls prevention discussed Falls prevention discussed;Education provided;Falls evaluation completed Falls prevention discussed Falls prevention discussed     Functional Status Survey: Is the patient deaf or have difficulty hearing?: No Does the patient have difficulty seeing, even when wearing glasses/contacts?: No Does the patient have difficulty concentrating, remembering, or making decisions?: No Does the patient have difficulty walking or climbing stairs?: No Does the patient have difficulty dressing or bathing?: No Does the patient have difficulty doing errands alone such as visiting a doctor's office or shopping?: No   Assessment & Plan:    CPE completed today  USPSTF grade A and B recommendations reviewed with patient; age-appropriate recommendations, preventive care, screening tests, etc discussed and encouraged; healthy living encouraged; see AVS for patient education given to patient  Discussed importance of 150 minutes of physical activity weekly, AHA exercise recommendations given to pt in AVS/handout  Discussed importance of healthy diet:  eating lean meats and proteins, avoiding trans fats and saturated fats, avoid simple sugars and excessive carbs in diet, eat 6 servings of fruit/vegetables daily and drink plenty of water and avoid sweet beverages.    Recommended pt to do annual eye exam and routine dental exams/cleanings  Depression, alcohol, fall screening completed as documented above and per flowsheets  Advance Care planning information and packet discussed and offered today, encouraged pt to discuss with family members/spouse/partner/friends and complete  Advanced directive packet and bring copy to office   Reviewed Health Maintenance: Health Maintenance  Topic Date Due   Cervical Cancer Screening (Pap smear)  09/16/2023   COVID-19 Vaccine (3 - 2024-25 season) 10/02/2023 (Originally 12/15/2022)   Zoster Vaccines- Shingrix (1 of 2) 12/17/2023 (Originally 09/29/2010)   MAMMOGRAM  10/02/2023   INFLUENZA VACCINE  11/14/2023   Fecal DNA (Cologuard)  11/07/2024   DTaP/Tdap/Td (2 - Td or Tdap) 05/16/2029   Hepatitis C Screening  Completed   HIV Screening  Completed   HPV VACCINES  Aged Out   Meningococcal B Vaccine  Aged Out    Immunizations: Immunization History  Administered Date(s) Administered   Moderna Sars-Covid-2 Vaccination 09/05/2019, 10/03/2019   Tdap 05/17/2019   Vaccines:  HPV: up to at age 61 , ask insurance if age between 29-45  Shingrix: 31-64 yo and ask insurance if covered when patient above 30 yo Pneumonia: due - recommended now at age 43 -  educated and discussed with patient. Flu: annually educated and discussed with patient    ICD-10-CM   1. Well adult exam  Z00.00 Comprehensive Metabolic Panel (CMET)    CBC with Differential/Platelet    Lipid Profile    2. Cervical high risk HPV (human papillomavirus) test positive  R87.810 Ambulatory referral to Obstetrics / Gynecology   multiple abnormal ASCUS + high risk HPV positive, needs to f/up with GYN - Dr. Dore [facility]    3. Encounter for screening mammogram for malignant neoplasm of breast  Z12.31 MM 3D SCREENING MAMMOGRAM BILATERAL BREAST    4. Sinus congestion  R09.81 fluticasone (FLONASE) 50 MCG/ACT nasal spray   enlarged nasal turbinates and suspected right nasal polyp, she wants to try flonase and consider ENT referral          Adeline Hone, PA-C 09/17/23 3:54 PM  [facility]

## 2023-09-17 NOTE — Patient Instructions (Addendum)
 Please call to schedule your mammogram due later this month  [facility] [address], [place], [place] [address] Scheduling phone #: [phone]   You need to follow up with Dr. Cleora Daft or another GYN regarding your abnormal PAP and HPV.  I have put in a referral  Health Maintenance  Topic Date Due   Pap Smear  09/16/2023   COVID-19 Vaccine (3 - 2024-25 season) 10/02/2023*   Zoster (Shingles) Vaccine (1 of 2) 12/17/2023*   Mammogram  10/02/2023   Flu Shot  11/14/2023   Cologuard (Stool DNA test)  11/07/2024   DTaP/Tdap/Td vaccine (2 - Td or Tdap) 05/16/2029   Hepatitis C Screening  Completed   HIV Screening  Completed   HPV Vaccine  Aged Out   Meningitis B Vaccine  Aged Out  *Topic was postponed. The date shown is not the original due date.     Start vit D 3 1000-2000 IU daily See what your calcium intake is, usually after menopause you need 800 mg supplement to support bones and prevent osteoporosis

## 2023-09-18 ENCOUNTER — Ambulatory Visit: Payer: Self-pay | Admitting: Family Medicine

## 2023-09-18 LAB — LIPID PANEL
Cholesterol: 116 mg/dL (ref <200)
HDL: 56 mg/dL (ref >=50)
LDL Cholesterol (Calc): 45 mg/dL
Non-HDL Cholesterol (Calc): 60 mg/dL (ref <130)
Total CHOL/HDL Ratio: 2.1 (calc) (ref <5.0)
Triglycerides: 66 mg/dL (ref <150)

## 2023-09-18 LAB — COMPREHENSIVE METABOLIC PANEL WITH GFR
AG Ratio: 1.1 (calc) (ref 1.0–2.5)
ALT: 15 U/L (ref 6–29)
AST: 16 U/L (ref 10–35)
Albumin: 3.9 g/dL (ref 3.6–5.1)
Alkaline phosphatase (APISO): 74 U/L (ref 37–153)
BUN: 16 mg/dL (ref 7–25)
CO2: 30 mmol/L (ref 20–32)
Calcium: 9.5 mg/dL (ref 8.6–10.4)
Chloride: 104 mmol/L (ref 98–110)
Creat: 0.79 mg/dL (ref 0.50–1.05)
Globulin: 3.4 g/dL (ref 1.9–3.7)
Glucose, Bld: 80 mg/dL (ref 65–99)
Potassium: 4.2 mmol/L (ref 3.5–5.3)
Sodium: 139 mmol/L (ref 135–146)
Total Bilirubin: 0.3 mg/dL (ref 0.2–1.2)
Total Protein: 7.3 g/dL (ref 6.1–8.1)
eGFR: 85 mL/min/{1.73_m2} (ref >=60)

## 2023-09-18 LAB — CBC WITH DIFFERENTIAL/PLATELET
Absolute Lymphocytes: 2455 {cells}/uL (ref 850–3900)
Absolute Monocytes: 638 {cells}/uL (ref 200–950)
Basophils Absolute: 30 {cells}/uL (ref 0–200)
Basophils Relative: 0.4 %
Eosinophils Absolute: 137 {cells}/uL (ref 15–500)
Eosinophils Relative: 1.8 %
HCT: 36.8 % (ref 35.0–45.0)
Hemoglobin: 11.9 g/dL (ref 11.7–15.5)
MCH: 29 pg (ref 27.0–33.0)
MCHC: 32.3 g/dL (ref 32.0–36.0)
MCV: 89.8 fL (ref 80.0–100.0)
MPV: 9.8 fL (ref 7.5–12.5)
Monocytes Relative: 8.4 %
Neutro Abs: 4340 {cells}/uL (ref 1500–7800)
Neutrophils Relative %: 57.1 %
Platelets: 281 10*3/uL (ref 140–400)
RBC: 4.1 10*6/uL (ref 3.80–5.10)
RDW: 12.5 % (ref 11.0–15.0)
Total Lymphocyte: 32.3 %
WBC: 7.6 10*3/uL (ref 3.8–10.8)

## 2024-01-15 ENCOUNTER — Ambulatory Visit
Admission: RE | Admit: 2024-01-15 | Discharge: 2024-01-15 | Disposition: A | Discharge status: Home / Self Care | Payer: PRIVATE HEALTH INSURANCE | Source: Ambulatory Visit | Attending: Family Medicine | Admitting: Family Medicine

## 2024-01-15 DIAGNOSIS — Z1231 Encounter for screening mammogram for malignant neoplasm of breast: Secondary | ICD-10-CM | POA: Diagnosis present

## 2024-04-02 ENCOUNTER — Other Ambulatory Visit: Payer: Self-pay

## 2024-04-02 ENCOUNTER — Emergency Department
Admission: EM | Admit: 2024-04-02 | Discharge: 2024-04-02 | Disposition: A | Discharge status: Home / Self Care | Payer: PRIVATE HEALTH INSURANCE | Attending: Emergency Medicine | Admitting: Emergency Medicine

## 2024-04-02 DIAGNOSIS — J020 Streptococcal pharyngitis: Secondary | ICD-10-CM | POA: Diagnosis not present

## 2024-04-02 DIAGNOSIS — J029 Acute pharyngitis, unspecified: Secondary | ICD-10-CM | POA: Diagnosis present

## 2024-04-02 LAB — GROUP A STREP BY PCR: Group A Strep by PCR: DETECTED — AB

## 2024-04-02 MED ORDER — AMOXICILLIN 875 MG PO TABS: 875.0000 mg | ORAL_TABLET | Freq: Two times a day (BID) | ORAL | 0 refills | Status: AC

## 2024-04-02 MED ORDER — LIDOCAINE VISCOUS HCL 2 % MT SOLN
15.0000 mL | Freq: Once | OROMUCOSAL | Status: AC
  Administered 2024-04-02: 15 mL via OROMUCOSAL
  Filled 2024-04-02: qty 15

## 2024-04-02 NOTE — Discharge Instructions (Signed)
 Your evaluated in the ED for sore throat.  Your strep test is positive.  We have sent amoxicillin antibiotics to your pharmacy.  Please take as instructed and until dose is complete.  Stay hydrated by drinking plenty of fluids. Get adequate amount of sleep and avoid overexertion. Consider a humidifier at night. Warm teas and a spoonful of honey may help. Follow up with your primary care provider as needed.   SORE THROAT Use throat lozenges or Chloraseptic spray.  Gargle with warm salt water several times daily

## 2024-04-02 NOTE — ED Provider Notes (Signed)
 "   [facility] Emergency Department Provider Note     Event Date/Time   First MD Initiated Contact with Patient 04/02/24 1620     (approximate)   History   Sore Throat   HPI  Gloria Fuller is a 63 y.o. female presents to the ED for evaluation of sore throat x 1 day.  No associated symptoms.  Denies fever and chills.  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  Denies shortness of breath, chest pain or difficulty swallowing.     Physical Exam   Triage Vital Signs: ED Triage Vitals  Encounter Vitals Group     BP 04/02/24 1411 (!) 145/85     Girls Systolic BP Percentile --      Girls Diastolic BP Percentile --      Boys Systolic BP Percentile --      Boys Diastolic BP Percentile --      Pulse Rate 04/02/24 1411 79     Resp 04/02/24 1411 15     Temp 04/02/24 1411 98 F (36.7 C)     Temp Source 04/02/24 1411 Oral     SpO2 04/02/24 1411 96 %     Weight 04/02/24 1412 190 lb (86.2 kg)     Height 04/02/24 1412 5' 6 (1.676 m)     Head Circumference --      Peak Flow --      Pain Score 04/02/24 1412 6     Pain Loc --      Pain Education --      Exclude from Growth Chart --     Most recent vital signs: Vitals:   04/02/24 1411  BP: (!) 145/85  Pulse: 79  Resp: 15  Temp: 98 F (36.7 C)  SpO2: 96%    General Awake, no distress.  HEENT NCAT. CV:  Good peripheral perfusion.  RRR RESP:  Normal effort.  LCTAB ABD:  No distention.  Other:  Oropharynx is clear.  Very mild erythema to bilateral tonsils.  No exudates noted.   ED Results / Procedures / Treatments   Labs (all labs ordered are listed, but only abnormal results are displayed) Labs Reviewed  GROUP A STREP BY PCR - Abnormal; Notable for the following components:      Result Value   Group A Strep by PCR DETECTED (*)    All other components within normal limits   No results found.  PROCEDURES:  Critical Care performed: No  Procedures   MEDICATIONS ORDERED IN  ED: Medications  lidocaine  (XYLOCAINE ) 2 % viscous mouth solution 15 mL (has no administration in time range)     IMPRESSION / MDM / ASSESSMENT AND PLAN / ED COURSE  I reviewed the triage vital signs and the nursing notes.                               63 y.o. female presents to the emergency department for evaluation and treatment of sore throat. See HPI for further details.   Differential diagnosis includes, but is not limited to strep pharyngitis, viral pharyngitis, rhinitis  Patient's presentation is most consistent with acute, uncomplicated illness.  Patient is alert and oriented.  She is hemodynamic stable.  Physical exam findings are stated above.  Rapid strep test is positive.  Amoxicillin  for 10 days sent to pharmacy. Lidocaine  viscous provided for symptomatic care and ED.  Patient is in stable condition at time of  discharge.  ED return precaution discussed.  FINAL CLINICAL IMPRESSION(S) / ED DIAGNOSES   Final diagnoses:  Strep pharyngitis   Rx / DC Orders   ED Discharge Orders     None      Note:  This document was prepared using Dragon voice recognition software and may include unintentional dictation errors.    Margrette, Kevonna Nolte A, PA-C 04/02/24 [place] Dicky Anes, MD 04/02/24 2115  "

## 2024-04-02 NOTE — ED Triage Notes (Signed)
 Pt c/o worsening sore throat since yesterday. Pt denies any postnasal drip or other associated symptoms.

## 2024-09-17 ENCOUNTER — Encounter: Payer: PRIVATE HEALTH INSURANCE | Admitting: Family Medicine
